# Patient Record
Sex: Male | Born: 1960
Health system: Southern US, Community
[De-identification: ages and names within clinical notes are randomized; demographics above are authoritative.]

## PROBLEM LIST (undated history)

## (undated) DIAGNOSIS — I10 Essential (primary) hypertension: Secondary | ICD-10-CM

## (undated) HISTORY — DX: Essential (primary) hypertension: I10

---

## 2015-06-29 ENCOUNTER — Ambulatory Visit (INDEPENDENT_AMBULATORY_CARE_PROVIDER_SITE_OTHER): Payer: Managed Care, Other (non HMO) | Admitting: *Deleted

## 2015-06-29 DIAGNOSIS — Z23 Encounter for immunization: Secondary | ICD-10-CM

## 2015-07-04 ENCOUNTER — Encounter: Payer: Self-pay | Admitting: Family Medicine

## 2015-07-04 ENCOUNTER — Ambulatory Visit (INDEPENDENT_AMBULATORY_CARE_PROVIDER_SITE_OTHER): Payer: Managed Care, Other (non HMO) | Admitting: Family Medicine

## 2015-07-04 VITALS — BP 149/75 | HR 64 | Temp 97.3°F | Ht 64.0 in | Wt 219.0 lb

## 2015-07-04 DIAGNOSIS — L929 Granulomatous disorder of the skin and subcutaneous tissue, unspecified: Secondary | ICD-10-CM

## 2015-07-04 DIAGNOSIS — L918 Other hypertrophic disorders of the skin: Secondary | ICD-10-CM | POA: Diagnosis not present

## 2015-07-04 NOTE — Progress Notes (Signed)
   Subjective:    Patient ID: Michael Carrillo, male    DOB: 11/29/1960, 54 y.o.   MRN: [id number]  HPI 54 year old gentleman who dropped a heavy object on his left great toe some years ago. Recently he was trying to trim the toe and there was some extra flash that he encountered and it has been sore since then. The extra flash persists he denies any bleeding but there is some pain and soreness    Review of Systems  Constitutional: Negative.   HENT: Negative.   Eyes: Negative.   Respiratory: Negative.  Negative for shortness of breath.   Cardiovascular: Negative.  Negative for chest pain and leg swelling.  Gastrointestinal: Negative.   Genitourinary: Negative.   Musculoskeletal: Negative.   Skin: Positive for wound.  Neurological: Negative.   Psychiatric/Behavioral: Negative.   All other systems reviewed and are negative.      There are no active problems to display for this patient.  No outpatient encounter prescriptions on file as of 07/04/2015.   No facility-administered encounter medications on file as of 07/04/2015.    Objective:   Physical Exam  Constitutional: He appears well-developed and well-nourished.  Skin:  Exam confined to left great toe. There is a small amount of granulation tissue overlapping the toenail from the lateral aspect. The toe was anesthetized with 1% cleaned with alcohol and then cauterize with silver nitrate. Hopefully this will resolve the issue. This granulation tissue should slough but I have instructed him that if he has any questions about how it's looking the first of next week to come back to the office.          Assessment & Plan:  1. Excessive granulation tissue of toe Procedure described above granulation tissue was cauterized using silver nitrate stick. He tolerated this well on simple dressing with 2 x 2 and Coban and an Neosporin was applied  Wardell Honour MD

## 2015-08-11 ENCOUNTER — Encounter: Payer: Self-pay | Admitting: Family Medicine

## 2015-08-11 ENCOUNTER — Ambulatory Visit (INDEPENDENT_AMBULATORY_CARE_PROVIDER_SITE_OTHER): Payer: Managed Care, Other (non HMO) | Admitting: Family Medicine

## 2015-08-11 VITALS — BP 154/81 | HR 70 | Temp 98.1°F | Ht 64.0 in | Wt 220.4 lb

## 2015-08-11 DIAGNOSIS — H7391 Unspecified disorder of tympanic membrane, right ear: Secondary | ICD-10-CM

## 2015-08-11 DIAGNOSIS — I10 Essential (primary) hypertension: Secondary | ICD-10-CM

## 2015-08-11 DIAGNOSIS — H7390 Unspecified disorder of tympanic membrane, unspecified ear: Secondary | ICD-10-CM | POA: Insufficient documentation

## 2015-08-11 MED ORDER — HYDROCHLOROTHIAZIDE 12.5 MG PO CAPS: 12.5000 mg | ORAL_CAPSULE | Freq: Every day | ORAL | Status: DC

## 2015-08-11 NOTE — Progress Notes (Signed)
   HPI  Patient presents today here with concern for something in his ear. Patient explains that yesterday he was working outside whenever he had above wind his right ear. He has irritation in that ear since that time. He had difficulty sleeping last night because of it. He denies any pain or current concerns for illness.  PMH: Smoking status noted ROS: Per HPI  Objective: BP 154/81 mmHg  Pulse 70  Temp(Src) 98.1 F (36.7 C) (Oral)  Ht 5\' 4"  (1.626 m)  Wt 220 lb 6.4 oz (99.973 kg)  BMI 37.81 kg/m2 Gen: NAD, alert, cooperative with exam HEENT: NCAT, right TM with no foreign bodies, he does have a hair touching his tympanic membrane CV: RRR, good S1/S2, no murmur Resp: CTABL, no wheezes, non-labored Ext: No edema, warm Neuro: Alert and oriented, No gross deficits  Assessment and plan:  # Tympanic membrane irritation Provided reassurance that there is no foreign body in his ear However I do see a hair touching his tympanic membrane, we have tried ear washing tonight in the clinic   # HTN Elevated on 2 occasions, recommended therapeutic lifestyle changes, offered medications and he would lik eto start them.  HCTZ, labs 4 week follow up  Laroy Apple, MD [facility] 08/11/2015, 4:54 PM

## 2015-08-11 NOTE — Patient Instructions (Signed)
Great to meet you!  If you have persistent problems we can get you set up to see An ENT to address the issue  Try to work in 20-30 minutes of aerobic exercise (fast walking is reasonable) 3-5 times per week, reduce you sodium intake  Come back in 6-8 weeks for a recheck of your blood pressure. You may need blood pressure medicine.

## 2015-08-12 LAB — CMP14+EGFR
ALK PHOS: 67 IU/L (ref 39–117)
ALT: 28 IU/L (ref 0–44)
AST: 32 IU/L (ref 0–40)
Albumin/Globulin Ratio: 1.8 (ref 1.1–2.5)
Albumin: 4.2 g/dL (ref 3.5–5.5)
BUN / CREAT RATIO: 17 (ref 9–20)
BUN: 19 mg/dL (ref 6–24)
CHLORIDE: 101 mmol/L (ref 97–106)
CO2: 26 mmol/L (ref 18–29)
Calcium: 9.1 mg/dL (ref 8.7–10.2)
Creatinine, Ser: 1.14 mg/dL (ref 0.76–1.27)
GFR calc Af Amer: 83 mL/min/{1.73_m2} (ref >59)
GFR calc non Af Amer: 71 mL/min/{1.73_m2} (ref >59)
GLUCOSE: 104 mg/dL — AB (ref 65–99)
Globulin, Total: 2.4 g/dL (ref 1.5–4.5)
Potassium: 3.9 mmol/L (ref 3.5–5.2)
SODIUM: 139 mmol/L (ref 136–144)
Total Protein: 6.6 g/dL (ref 6.0–8.5)

## 2015-08-12 LAB — CBC
Hematocrit: 45.9 % (ref 37.5–51.0)
Hemoglobin: 15.4 g/dL (ref 12.6–17.7)
MCH: 30.9 pg (ref 26.6–33.0)
MCHC: 33.6 g/dL (ref 31.5–35.7)
MCV: 92 fL (ref 79–97)
PLATELETS: 306 10*3/uL (ref 150–379)
RBC: 4.99 x10E6/uL (ref 4.14–5.80)
RDW: 13.8 % (ref 12.3–15.4)
WBC: 7.4 10*3/uL (ref 3.4–10.8)

## 2015-08-12 LAB — LIPID PANEL
CHOL/HDL RATIO: 2.5 ratio (ref 0.0–5.0)
CHOLESTEROL TOTAL: 185 mg/dL (ref 100–199)
HDL: 73 mg/dL (ref >39)
LDL CALC: 88 mg/dL (ref 0–99)
TRIGLYCERIDES: 119 mg/dL (ref 0–149)
VLDL Cholesterol Cal: 24 mg/dL (ref 5–40)

## 2015-08-15 ENCOUNTER — Telehealth: Payer: Self-pay | Admitting: *Deleted

## 2015-08-15 ENCOUNTER — Encounter: Payer: Self-pay | Admitting: *Deleted

## 2015-08-15 NOTE — Telephone Encounter (Signed)
Patient phone number is not in service.  Letter will be mailed with lab details and request for a new phone number.

## 2015-09-29 ENCOUNTER — Encounter: Payer: Self-pay | Admitting: Family Medicine

## 2015-09-29 ENCOUNTER — Ambulatory Visit (INDEPENDENT_AMBULATORY_CARE_PROVIDER_SITE_OTHER): Payer: Managed Care, Other (non HMO) | Admitting: Family Medicine

## 2015-09-29 VITALS — BP 132/72 | HR 72 | Temp 98.8°F | Ht 64.0 in | Wt 228.0 lb

## 2015-09-29 DIAGNOSIS — I10 Essential (primary) hypertension: Secondary | ICD-10-CM

## 2015-09-29 HISTORY — DX: Essential (primary) hypertension: I10

## 2015-09-29 MED ORDER — HYDROCHLOROTHIAZIDE 12.5 MG PO CAPS: 12.5000 mg | ORAL_CAPSULE | Freq: Every day | ORAL | Status: DC

## 2015-09-29 NOTE — Progress Notes (Signed)
   HPI  Patient presents today here for follow-up hypertension.  Patient reports good medication compliance He denies chest pain, dyspnea, palpitations, leg edema. He is not watching his diet very closely but is motivated to change. He is not checking his blood pressure at home.  His previous ear symptoms resolved shortly after leaving last visit  PMH: Smoking status noted ROS: Per HPI  Objective: BP 132/72 mmHg  Pulse 72  Temp(Src) 98.8 F (37.1 C) (Oral)  Ht '5\' 4"'$  (1.626 m)  Wt 228 lb (103.42 kg)  BMI 39.12 kg/m2 Gen: NAD, alert, cooperative with exam HEENT: NCAT, TMs normal bilaterally, oropharynx clear CV: RRR, good S1/S2, no murmur Resp: CTABL, no wheezes, non-labored Ext: No edema, warm Neuro: Alert and oriented, No gross deficits  Assessment and plan:  # Hypertension Well controlled Continue HCTZ Repeat labs, follow-up 4 months    Orders Placed This Encounter  Procedures  . CMP14+EGFR    Meds ordered this encounter  Medications  . hydrochlorothiazide (MICROZIDE) 12.5 MG capsule    Sig: Take 1 capsule (12.5 mg total) by mouth daily.    Dispense:  90 capsule    Refill:  [place], MD [facility] 09/29/2015, 5:00 PM

## 2015-09-29 NOTE — Patient Instructions (Signed)
Great to see you!  We will call with results within 1 week  Come back in 4 months

## 2015-10-02 ENCOUNTER — Other Ambulatory Visit: Payer: Self-pay | Admitting: Family Medicine

## 2015-10-02 ENCOUNTER — Telehealth: Payer: Self-pay

## 2015-10-02 NOTE — Telephone Encounter (Signed)
No changes as BP was controlled  Laroy Apple, MD [facility] 10/02/2015, 12:21 PM

## 2015-10-02 NOTE — Telephone Encounter (Signed)
Pt aware that his BP was under control so no changes in medication.

## 2015-10-02 NOTE — Telephone Encounter (Signed)
Called and left a voicemail describing that we wanted to keep his blood pressure medication the same because his blood pressure normalized throughout the exam.  Welcomed him to call back if he needs more clarification.   Laroy Apple, MD [facility] 10/02/2015, 4:05 PM

## 2015-10-02 NOTE — Telephone Encounter (Signed)
Pt called and was seen Friday and he thought you were changing his bp medication. He states you told him to stop old bp and start new one but when he went to pick up his new rx it was the same bp medication he was on. Please advise if he miss understood or your changing his bp medication.

## 2015-10-06 ENCOUNTER — Ambulatory Visit (INDEPENDENT_AMBULATORY_CARE_PROVIDER_SITE_OTHER): Payer: Managed Care, Other (non HMO) | Admitting: Family Medicine

## 2015-10-06 ENCOUNTER — Encounter: Payer: Self-pay | Admitting: Family Medicine

## 2015-10-06 VITALS — BP 127/60 | HR 77 | Temp 99.8°F | Ht 64.0 in | Wt 226.4 lb

## 2015-10-06 DIAGNOSIS — J209 Acute bronchitis, unspecified: Secondary | ICD-10-CM

## 2015-10-06 MED ORDER — FLUTICASONE PROPIONATE 50 MCG/ACT NA SUSP: 1.0000 | Freq: Two times a day (BID) | NASAL | Status: DC | PRN

## 2015-10-06 MED ORDER — AZITHROMYCIN 250 MG PO TABS: ORAL_TABLET | ORAL | Status: DC

## 2015-10-06 NOTE — Progress Notes (Signed)
BP 127/60 mmHg  Pulse 77  Temp(Src) 99.8 F (37.7 C) (Oral)  Ht 5\' 4"  (1.626 m)  Wt 226 lb 6.4 oz (102.694 kg)  BMI 38.84 kg/m2   Subjective:    Patient ID: Michael Carrillo, male    DOB: [birth year], 55 y.o.   MRN: [id number]  HPI: Randy Eggum is a 55 y.o. male presenting on 10/06/2015 for Bronchitis and Cough   HPI Sinus congestion and cough Patient has been having sinus congestion and cough for a 2 days but just today it started going down into his chest and his cough has become a lot more rattly. He denies any fevers or chills except a low-grade temperature he had ear which is 99.11F. He denies any sick contacts that he knows of. Most of his symptoms have been the nasal congestion and a postnasal drainage and a cough that is worse at night and pain coming across right side of his ribs with the coughing.  Relevant past medical, surgical, family and social history reviewed and updated as indicated. Interim medical history since our last visit reviewed. Allergies and medications reviewed and updated.  Review of Systems  Constitutional: Negative for fever and chills.  HENT: Positive for congestion, postnasal drip, rhinorrhea, sinus pressure and sore throat. Negative for ear discharge, ear pain, sneezing and voice change.   Eyes: Negative for pain, discharge, redness and visual disturbance.  Respiratory: Positive for cough and chest tightness. Negative for shortness of breath and wheezing.   Cardiovascular: Negative for chest pain and leg swelling.  Gastrointestinal: Negative for abdominal pain, diarrhea and constipation.  Genitourinary: Negative for difficulty urinating.  Musculoskeletal: Negative for back pain and gait problem.  Skin: Negative for rash.  Neurological: Negative for syncope, light-headedness and headaches.  All other systems reviewed and are negative.   Per HPI unless specifically indicated above     Medication List       This list is accurate as of:  10/06/15 11:57 AM.  Always use your most recent med list.               azithromycin 250 MG tablet  Commonly known as:  ZITHROMAX  Take 2 the first day and then one each day after.     fluticasone 50 MCG/ACT nasal spray  Commonly known as:  FLONASE  Place 1 spray into both nostrils 2 (two) times daily as needed for allergies or rhinitis.     hydrochlorothiazide 12.5 MG capsule  Commonly known as:  MICROZIDE  Take 1 capsule (12.5 mg total) by mouth daily.           Objective:    BP 127/60 mmHg  Pulse 77  Temp(Src) 99.8 F (37.7 C) (Oral)  Ht 5\' 4"  (1.626 m)  Wt 226 lb 6.4 oz (102.694 kg)  BMI 38.84 kg/m2  Wt Readings from Last 3 Encounters:  10/06/15 226 lb 6.4 oz (102.694 kg)  09/29/15 228 lb (103.42 kg)  08/11/15 220 lb 6.4 oz (99.973 kg)    Physical Exam  Constitutional: He is oriented to person, place, and time. He appears well-developed and well-nourished. No distress.  HENT:  Right Ear: Tympanic membrane, external ear and ear canal normal.  Left Ear: Tympanic membrane, external ear and ear canal normal.  Nose: Mucosal edema and rhinorrhea present. No sinus tenderness. No epistaxis. Right sinus exhibits no maxillary sinus tenderness and no frontal sinus tenderness. Left sinus exhibits no maxillary sinus tenderness and no frontal sinus tenderness.  Mouth/Throat: Uvula is midline  and mucous membranes are normal. Posterior oropharyngeal edema and posterior oropharyngeal erythema present. No oropharyngeal exudate or tonsillar abscesses.  Eyes: Conjunctivae and EOM are normal. Right eye exhibits no discharge. No scleral icterus.  Neck: Neck supple. No thyromegaly present.  Cardiovascular: Normal rate, regular rhythm, normal heart sounds and intact distal pulses.   No murmur heard. Pulmonary/Chest: Effort normal and breath sounds normal. No respiratory distress. He has no wheezes. He exhibits tenderness (right intercostal chest tenderness upon palpation).  Abdominal: He  exhibits no distension.  Musculoskeletal: Normal range of motion. He exhibits no edema.  Lymphadenopathy:    He has no cervical adenopathy.  Neurological: He is alert and oriented to person, place, and time. Coordination normal.  Skin: Skin is warm and dry. No rash noted. He is not diaphoretic.  Psychiatric: He has a normal mood and affect. His behavior is normal.  Vitals reviewed.      Assessment & Plan:   Problem List Items Addressed This Visit    None    Visit Diagnoses    Acute bronchitis, unspecified organism    -  Primary    Patient has sinus congestion as well, Flonase and antihistamine and Mucinex, is not working then pick up the azithromycin.    Relevant Medications    fluticasone (FLONASE) 50 MCG/ACT nasal spray    azithromycin (ZITHROMAX) 250 MG tablet        Follow up plan: Return if symptoms worsen or fail to improve.  Counseling provided for all of the vaccine components No orders of the defined types were placed in this encounter.    Caryl Pina, MD [facility] 10/06/2015, 11:57 AM

## 2015-12-27 ENCOUNTER — Encounter (INDEPENDENT_AMBULATORY_CARE_PROVIDER_SITE_OTHER): Payer: Self-pay

## 2016-01-01 ENCOUNTER — Ambulatory Visit (INDEPENDENT_AMBULATORY_CARE_PROVIDER_SITE_OTHER): Payer: Managed Care, Other (non HMO)

## 2016-01-01 ENCOUNTER — Ambulatory Visit (INDEPENDENT_AMBULATORY_CARE_PROVIDER_SITE_OTHER): Payer: Managed Care, Other (non HMO) | Admitting: Family Medicine

## 2016-01-01 ENCOUNTER — Encounter: Payer: Self-pay | Admitting: Family Medicine

## 2016-01-01 VITALS — BP 138/75 | HR 81 | Temp 98.2°F | Ht 64.0 in | Wt 226.0 lb

## 2016-01-01 DIAGNOSIS — M79642 Pain in left hand: Secondary | ICD-10-CM

## 2016-01-01 DIAGNOSIS — M79632 Pain in left forearm: Secondary | ICD-10-CM | POA: Diagnosis not present

## 2016-01-01 MED ORDER — CYCLOBENZAPRINE HCL 10 MG PO TABS: 10.0000 mg | ORAL_TABLET | Freq: Three times a day (TID) | ORAL | Status: DC | PRN

## 2016-01-01 MED ORDER — OXYCODONE-ACETAMINOPHEN 5-325 MG PO TABS: 1.0000 | ORAL_TABLET | Freq: Three times a day (TID) | ORAL | Status: DC | PRN

## 2016-01-01 NOTE — Patient Instructions (Signed)
Great to see you today!   Oxycodone is a narcotic, do not drive after you take it for at least 6 hours.   Flexeril is a muscle releaxer, it can be helpful for night time pain, it may make you sleepy  Continue etodolac.   Ry ice or heat to see if it helps.
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
## 2016-01-29 ENCOUNTER — Ambulatory Visit (INDEPENDENT_AMBULATORY_CARE_PROVIDER_SITE_OTHER): Payer: Managed Care, Other (non HMO) | Admitting: Family Medicine

## 2016-01-29 ENCOUNTER — Encounter: Payer: Self-pay | Admitting: Family Medicine

## 2016-01-29 VITALS — BP 139/73 | HR 55 | Temp 97.5°F | Ht 64.0 in | Wt 227.2 lb

## 2016-01-29 DIAGNOSIS — Z0000 Encounter for general adult medical examination without abnormal findings: Secondary | ICD-10-CM | POA: Insufficient documentation

## 2016-01-29 DIAGNOSIS — I10 Essential (primary) hypertension: Secondary | ICD-10-CM | POA: Diagnosis not present

## 2016-01-29 NOTE — Progress Notes (Signed)
   HPI  Patient presents today for follow-up hypertension.  Patient states he is doing well. Watching his diet much more closely than previous, he is watching his salt intake specifically. He's also cut back on fatty foods. 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  He denies any chest pain, shortness of breath, outpatient dictations, or leg edema.  He is willing to get a colonoscopy.  He states his wrist pain is improved.  PMH: Smoking status noted ROS: Per HPI  Objective: BP 139/73 mmHg  Pulse 55  Temp(Src) 97.5 F (36.4 C) (Oral)  Ht '5\' 4"'$  (1.626 m)  Wt 227 lb 3.2 oz (103.057 kg)  BMI 38.98 kg/m2 Gen: NAD, alert, cooperative with exam HEENT: NCAT, nares clear, TMs normal bilaterally, oropharynx clear CV: RRR, good S1/S2, no murmur Resp: CTABL, no wheezes, non-labored Ext: No edema, warm Neuro: Alert and oriented, No gross deficits  Assessment and plan:  # HTN Reasonable control Repeat BMP, has not been repeated since starting HCTZ in November Follow-up 4 months  # Healthcare maintenance Referring for colonoscopy Hep C antibody Checked     Orders Placed This Encounter  Procedures  . BMP8+EGFR  . Hepatitis C antibody  . Ambulatory referral to Gastroenterology    Referral Priority:  Routine    Referral Type:  Consultation    Referral Reason:  Specialty Services Required    Number of Visits Requested:  [place], MD [facility] 01/29/2016, 4:44 PM

## 2016-01-29 NOTE — Patient Instructions (Signed)
Great to see you!  We will call with lab results  You will be called to arrange a colonoscopy  Lets follow up in September.

## 2016-01-30 LAB — BMP8+EGFR
BUN/Creatinine Ratio: 17 (ref 9–20)
BUN: 17 mg/dL (ref 6–24)
CALCIUM: 9.5 mg/dL (ref 8.7–10.2)
CO2: 24 mmol/L (ref 18–29)
CREATININE: 0.98 mg/dL (ref 0.76–1.27)
Chloride: 98 mmol/L (ref 96–106)
GFR calc Af Amer: 99 mL/min/{1.73_m2} (ref >59)
GFR, EST NON AFRICAN AMERICAN: 86 mL/min/{1.73_m2} (ref >59)
Glucose: 97 mg/dL (ref 65–99)
POTASSIUM: 3.8 mmol/L (ref 3.5–5.2)
Sodium: 138 mmol/L (ref 134–144)

## 2016-01-30 LAB — HEPATITIS C ANTIBODY: Hep C Virus Ab: <0.1 s/co ratio (ref 0.0–0.9)

## 2016-02-01 ENCOUNTER — Telehealth: Payer: Self-pay

## 2016-02-07 NOTE — Telephone Encounter (Signed)
Pt said he will have to have a clinic not a hospital. I LMOM for a return call.  He can have his PCP refer him to [facility] or [facility].

## 2016-03-07 NOTE — Telephone Encounter (Signed)
PT is aware to have PCP send referral to [facility] or [facility].

## 2016-03-21 ENCOUNTER — Encounter: Payer: Managed Care, Other (non HMO) | Admitting: Family Medicine

## 2016-04-01 ENCOUNTER — Ambulatory Visit (INDEPENDENT_AMBULATORY_CARE_PROVIDER_SITE_OTHER): Payer: Managed Care, Other (non HMO) | Admitting: Family Medicine

## 2016-04-01 ENCOUNTER — Encounter: Payer: Self-pay | Admitting: Family Medicine

## 2016-04-01 VITALS — BP 123/59 | HR 77 | Temp 97.3°F | Ht 64.0 in | Wt 210.0 lb

## 2016-04-01 DIAGNOSIS — Z0000 Encounter for general adult medical examination without abnormal findings: Secondary | ICD-10-CM

## 2016-04-01 DIAGNOSIS — Z1211 Encounter for screening for malignant neoplasm of colon: Secondary | ICD-10-CM

## 2016-04-01 NOTE — Patient Instructions (Addendum)
Great to see you!  Lets plan on seeing you again in about 6 months to follow up for blood pressure and check your labs again.

## 2016-04-01 NOTE — Progress Notes (Signed)
   HPI  Patient presents today here for annual physical exam.  He has no complaints today.  Hypertension Medication compliance is good No chest pain, dyspnea, palpitations, leg edema.  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 He does not watch his diet very closely but states that he would like to.  He denies smoking.  He would like to get a colonoscopy but his insurance company is not covering it well.  His back pain is resolved.  PMH: Smoking status noted ROS: Per HPI  Objective: BP 123/59 mmHg  Pulse 77  Temp(Src) 97.3 F (36.3 C) (Oral)  Ht 5\' 4"  (1.626 m)  Wt 210 lb (95.255 kg)  BMI 36.03 kg/m2 Gen: NAD, alert, cooperative with exam HEENT: NCAT CV: RRR, good S1/S2, no murmur Resp: CTABL, no wheezes, non-labored Abd: SNTND, BS present, no guarding or organomegaly Ext: No edema, warm Neuro: Alert and oriented, strength 5/5 and sensation intact in bilateral lower extremities, 1+ patellar tendon reflexes symmetric  Assessment and plan:  # Annual physical exam Normal exam Labs are up-to-date Continue Offered tetanus shot today, however he will have to call his insurance company to see if it covered    # Hypertension Well-controlled on HCTZ Continue  # screening  for colon cancer GI were placed, he is working with his insurance coverage     Orders Placed This Encounter  Procedures  . Ambulatory referral to Gastroenterology    Referral Priority:  Routine    Referral Type:  Consultation    Referral Reason:  Specialty Services Required    Number of Visits Requested:  [place], MD [facility] 04/01/2016, 2:34 PM

## 2016-04-04 ENCOUNTER — Telehealth: Payer: Self-pay

## 2016-04-04 NOTE — Telephone Encounter (Addendum)
Gastroenterology Pre-Procedure Review  Request Date: 04/04/2016 Requesting Physician: Dr. Wendi Snipes at [facility]  Pt had told me previously that his insurance would not cover at a facility and he was going to ask PCP for referral to [facility] or [facility]. He called back and said they said that they would pay if he has it at the hospital. I made pt aware that I just check to see if he requires a prior authorization. He said he would like me to schedule the procedure for him.   PATIENT REVIEW QUESTIONS: The patient responded to the following health history questions as indicated:    1. Diabetes Melitis: no 2. Joint replacements in the past 12 months: no 3. Major health problems in the past 3 months: no 4. Has an artificial valve or MVP: no 5. Has a defibrillator: no 6. Has been advised in past to take antibiotics in advance of a procedure like teeth cleaning: no 7. Family history of colon cancer: no  8. Alcohol Use: He said he drinks 2 beers every other day ( he had originally told me he drinks 2 cans of beer on Friday) 9. History of sleep apnea: no   Note: he said he is not taking the oxycodone all of the time, but still has some    MEDICATIONS & ALLERGIES:    Patient reports the following regarding taking any blood thinners:   Plavix? no Aspirin? no Coumadin? no  Patient confirms/reports the following medications:  Current Outpatient Prescriptions  Medication Sig Dispense Refill  . cyclobenzaprine (FLEXERIL) 10 MG tablet Take 1 tablet (10 mg total) by mouth 3 (three) times daily as needed for muscle spasms. 30 tablet 0  . etodolac (LODINE) 400 MG tablet     . fluticasone (FLONASE) 50 MCG/ACT nasal spray Place 1 spray into both nostrils 2 (two) times daily as needed for allergies or rhinitis. 16 g 2  . hydrochlorothiazide (MICROZIDE) 12.5 MG capsule Take 1 capsule (12.5 mg total) by mouth daily. 90 capsule 3   No current facility-administered medications for this visit.    Patient  confirms/reports the following allergies:  No Known Allergies  No orders of the defined types were placed in this encounter.    AUTHORIZATION INFORMATION Primary Insurance:  ID #: Group #:  Pre-Cert / Auth required:  Pre-Cert / Auth #:   Secondary Insurance:  ID #:  Group #:  Pre-Cert / Auth required:  Pre-Cert / Auth #:   SCHEDULE INFORMATION: Procedure has been scheduled as follows:  Date:                    Time:   Location:   This Gastroenterology Pre-Precedure Review Form is being routed to the following provider(s): R. Garfield Cornea, MD

## 2016-04-05 NOTE — Telephone Encounter (Signed)
Please give Phenergan 25mg  IV 30 minutes before the procedure. Ok to schedule.

## 2016-04-08 ENCOUNTER — Other Ambulatory Visit: Payer: Self-pay

## 2016-04-08 DIAGNOSIS — Z1211 Encounter for screening for malignant neoplasm of colon: Secondary | ICD-10-CM

## 2016-04-08 MED ORDER — NA SULFATE-K SULFATE-MG SULF 17.5-3.13-1.6 GM/177ML PO SOLN: 1.0000 | ORAL | Status: DC

## 2016-04-08 MED ORDER — PROMETHAZINE HCL 25 MG/ML IJ SOLN: 25.0000 mg | Freq: Once | INTRAMUSCULAR | Status: AC

## 2016-04-08 NOTE — Telephone Encounter (Signed)
Spoke to pt's wife, Hilda Blades, and she took date and time.  He is scheduled for colonoscopy with Dr. Gala Romney on 05/01/2016 at 10:30.

## 2016-04-08 NOTE — Telephone Encounter (Signed)
Tried to call pt and could not leave a VM.  

## 2016-04-08 NOTE — Telephone Encounter (Signed)
Rx sent to the pharmacy and instructions mailed to pt.  

## 2016-04-09 ENCOUNTER — Telehealth: Payer: Self-pay | Admitting: Internal Medicine

## 2016-04-09 NOTE — Telephone Encounter (Signed)
Pt called asking to speak with DS. I told him DS was not available at the moment. Pt was transferred to VM per patient.

## 2016-04-09 NOTE — Telephone Encounter (Signed)
I have spoke to the pt. He is scheduled for 05/01/2016 at 11:30 Am with Dr. Gala Romney for the colonoscopy and I am mailing new instructions for the time change from 10:30 to 11:30 Am.  He is aware Rx was sent and he will call if there is a problem.

## 2016-04-29 ENCOUNTER — Telehealth: Payer: Self-pay

## 2016-04-29 NOTE — Telephone Encounter (Signed)
I called  [employer] at [phone] and spoke to [place] who said that at [place] is not required for a screening colonoscopy as out patient.

## 2016-04-30 ENCOUNTER — Other Ambulatory Visit: Payer: Self-pay | Admitting: Family Medicine

## 2016-04-30 DIAGNOSIS — Z0000 Encounter for general adult medical examination without abnormal findings: Secondary | ICD-10-CM

## 2016-05-01 ENCOUNTER — Ambulatory Visit (HOSPITAL_COMMUNITY)
Admission: RE | Admit: 2016-05-01 | Discharge: 2016-05-01 | Disposition: A | Discharge status: Home / Self Care | Payer: Managed Care, Other (non HMO) | Source: Ambulatory Visit | Attending: Internal Medicine | Admitting: Internal Medicine

## 2016-05-01 ENCOUNTER — Encounter (HOSPITAL_COMMUNITY)
Admission: RE | Disposition: A | Discharge status: Home / Self Care | Payer: Self-pay | Source: Ambulatory Visit | Attending: Internal Medicine

## 2016-05-01 ENCOUNTER — Encounter (HOSPITAL_COMMUNITY): Payer: Self-pay | Admitting: *Deleted

## 2016-05-01 DIAGNOSIS — D128 Benign neoplasm of rectum: Secondary | ICD-10-CM | POA: Insufficient documentation

## 2016-05-01 DIAGNOSIS — Z1211 Encounter for screening for malignant neoplasm of colon: Secondary | ICD-10-CM | POA: Diagnosis not present

## 2016-05-01 DIAGNOSIS — Z7982 Long term (current) use of aspirin: Secondary | ICD-10-CM | POA: Diagnosis not present

## 2016-05-01 DIAGNOSIS — Z79899 Other long term (current) drug therapy: Secondary | ICD-10-CM | POA: Diagnosis not present

## 2016-05-01 DIAGNOSIS — I10 Essential (primary) hypertension: Secondary | ICD-10-CM | POA: Diagnosis not present

## 2016-05-01 DIAGNOSIS — Z87891 Personal history of nicotine dependence: Secondary | ICD-10-CM | POA: Insufficient documentation

## 2016-05-01 HISTORY — PX: COLONOSCOPY: SHX5424

## 2016-05-01 HISTORY — PX: POLYPECTOMY: SHX5525

## 2016-05-01 SURGERY — COLONOSCOPY
Anesthesia: Moderate Sedation

## 2016-05-01 MED ORDER — MIDAZOLAM HCL 5 MG/5ML IJ SOLN
INTRAMUSCULAR | Status: DC | PRN
  Administered 2016-05-01 (×2): 2 mg via INTRAVENOUS

## 2016-05-01 MED ORDER — MIDAZOLAM HCL 5 MG/5ML IJ SOLN
INTRAMUSCULAR | Status: AC
  Filled 2016-05-01: qty 10

## 2016-05-01 MED ORDER — MEPERIDINE HCL 100 MG/ML IJ SOLN
INTRAMUSCULAR | Status: DC | PRN
  Administered 2016-05-01: 50 mg via INTRAVENOUS
  Administered 2016-05-01: 25 mg via INTRAVENOUS

## 2016-05-01 MED ORDER — STERILE WATER FOR IRRIGATION IR SOLN
Status: DC | PRN
  Administered 2016-05-01: 12:00:00

## 2016-05-01 MED ORDER — ONDANSETRON HCL 4 MG/2ML IJ SOLN
INTRAMUSCULAR | Status: DC | PRN
  Administered 2016-05-01: 4 mg via INTRAVENOUS

## 2016-05-01 MED ORDER — SODIUM CHLORIDE 0.9 % IV SOLN
INTRAVENOUS | Status: DC
  Administered 2016-05-01: 1000 mL via INTRAVENOUS

## 2016-05-01 MED ORDER — MEPERIDINE HCL 100 MG/ML IJ SOLN
INTRAMUSCULAR | Status: AC
  Filled 2016-05-01: qty 2

## 2016-05-01 MED ORDER — ONDANSETRON HCL 4 MG/2ML IJ SOLN
INTRAMUSCULAR | Status: AC
  Filled 2016-05-01: qty 2

## 2016-05-01 NOTE — Op Note (Signed)
[facility] Patient Name: Michael Carrillo Procedure Date: 05/01/2016 11:45 AM MRN: [id number] Date of Birth: October 03, 1960 Attending MD: Norvel Richards , MD CSN: [id number] Age: 55 Admit Type: Outpatient Procedure:                Colonoscopy with snare polypectomy Indications:              Screening for colorectal malignant neoplasm Providers:                Norvel Richards, MD, Jeanann Lewandowsky. Gwenlyn Perking RN, RN,                            Randa Spike, Technician Referring MD:              Medicines:                Midazolam 4 mg IV, Meperidine 75 mg IV, Ondansetron                            4 mg IV Complications:            No immediate complications. Estimated Blood Loss:     Estimated blood loss was minimal. Procedure:                Pre-Anesthesia Assessment:                           - Prior to the procedure, a History and Physical                            was performed, and patient medications and                            allergies were reviewed. The patient's tolerance of                            previous anesthesia was also reviewed. The risks                            and benefits of the procedure and the sedation                            options and risks were discussed with the patient.                            All questions were answered, and informed consent                            was obtained. Prior Anticoagulants: The patient has                            taken no previous anticoagulant or antiplatelet                            agents. ASA Grade Assessment: II - A patient with  mild systemic disease. After reviewing the risks                            and benefits, the patient was deemed in                            satisfactory condition to undergo the procedure.                           - Prior to the procedure, a History and Physical                            was performed, and patient medications and                 allergies were reviewed. The patient's tolerance of                            previous anesthesia was also reviewed. The risks                            and benefits of the procedure and the sedation                            options and risks were discussed with the patient.                            All questions were answered, and informed consent                            was obtained. Prior Anticoagulants: The patient has                            taken no previous anticoagulant or antiplatelet                            agents. ASA Grade Assessment: II - A patient with                            mild systemic disease. After reviewing the risks                            and benefits, the patient was deemed in                            satisfactory condition to undergo the procedure.                           After obtaining informed consent, the colonoscope                            was passed under direct vision. Throughout the  procedure, the patient's blood pressure, pulse, and                            oxygen saturations were monitored continuously. The                            EC-[id number] scope was introduced through                            the anus and advanced to the the cecum, identified                            by appendiceal orifice and ileocecal valve. The                            colonoscopy was performed without difficulty. The                            patient tolerated the procedure well. The quality                            of the bowel preparation was good. The entire colon                            was well visualized. The ileocecal valve,                            appendiceal orifice, and rectum were photographed. Scope In: 11:58:01 AM Scope Out: 12:09:36 PM Scope Withdrawal Time: 0 hours 8 minutes 34 seconds  Total Procedure Duration: 0 hours 11 minutes 35 seconds  Findings:      The perianal  and digital rectal examinations were normal.      A 4 mm polyp was found in the rectum. The polyp was sessile. The polyp       was removed with a cold snare. Resection and retrieval were complete.       Estimated blood loss was minimal.      The exam was otherwise without abnormality on direct and retroflexion       views. Impression:               - One 4 mm polyp in the rectum, removed with a cold                            snare. Resected and retrieved.                           - The examination was otherwise normal on direct                            and retroflexion views. Moderate Sedation:      Moderate (conscious) sedation was administered by the endoscopy nurse       and supervised by the endoscopist. The following parameters were       monitored: oxygen saturation, heart rate, blood pressure, respiratory       rate, EKG,  adequacy of pulmonary ventilation, and response to care.       Total physician intraservice time was 19 minutes. Recommendation:           - Patient has a contact number available for                            emergencies. The signs and symptoms of potential                            delayed complications were discussed with the                            patient. Return to normal activities tomorrow.                            Written discharge instructions were provided to the                            patient.                           - Resume previous diet.                           - Continue present medications.                           - Repeat colonoscopy date to be determined after                            pending pathology results are reviewed for                            surveillance based on pathology results.                           - Return to GI clinic (date not yet determined). Procedure Code(s):        --- Professional ---                           [phone], Colonoscopy, flexible; with removal of                            tumor(s),  polyp(s), or other lesion(s) by snare                            technique                           99152, Moderate sedation services provided by the                            same physician or other qualified health care                            professional performing the  diagnostic or                            therapeutic service that the sedation supports,                            requiring the presence of an independent trained                            observer to assist in the monitoring of the                            patient's level of consciousness and physiological                            status; initial 15 minutes of intraservice time,                            patient age 69 years or older Diagnosis Code(s):        --- Professional ---                           Z12.11, Encounter for screening for malignant                            neoplasm of colon                           K62.1, Rectal polyp CPT copyright 2016 [employer]. All rights reserved. The codes documented in this report are preliminary and upon coder review may  be revised to meet current compliance requirements. Cristopher Estimable. Rourk, MD Norvel Richards, MD 05/01/2016 12:14:00 PM This report has been signed electronically. Number of Addenda: 0

## 2016-05-01 NOTE — Discharge Instructions (Signed)
°Colonoscopy °Discharge Instructions ° °Read the instructions outlined below and refer to this sheet in the next few weeks. These discharge instructions provide you with general information on caring for yourself after you leave the hospital. Your doctor may also give you specific instructions. While your treatment has been planned according to the most current medical practices available, unavoidable complications occasionally occur. If you have any problems or questions after discharge, call Dr. Rourk at [phone]. °ACTIVITY °· You may resume your regular activity, but move at a slower pace for the next 24 hours.  °· Take frequent rest periods for the next 24 hours.  °· Walking will help get rid of the air and reduce the bloated feeling in your belly (abdomen).  °· No driving for 24 hours (because of the medicine (anesthesia) used during the test).   °· Do not sign any important legal documents or operate any machinery for 24 hours (because of the anesthesia used during the test).  °NUTRITION °· Drink plenty of fluids.  °· You may resume your normal diet as instructed by your doctor.  °· Begin with a light meal and progress to your normal diet. Heavy or fried foods are harder to digest and may make you feel sick to your stomach (nauseated).  °· Avoid alcoholic beverages for 24 hours or as instructed.  °MEDICATIONS °· You may resume your normal medications unless your doctor tells you otherwise.  °WHAT YOU CAN EXPECT TODAY °· Some feelings of bloating in the abdomen.  °· Passage of more gas than usual.  °· Spotting of blood in your stool or on the toilet paper.  °IF YOU HAD POLYPS REMOVED DURING THE COLONOSCOPY: °· No aspirin products for 7 days or as instructed.  °· No alcohol for 7 days or as instructed.  °· Eat a soft diet for the next 24 hours.  °FINDING OUT THE RESULTS OF YOUR TEST °Not all test results are available during your visit. If your test results are not back during the visit, make an appointment  with your caregiver to find out the results. Do not assume everything is normal if you have not heard from your caregiver or the medical facility. It is important for you to follow up on all of your test results.  °SEEK IMMEDIATE MEDICAL ATTENTION IF: °· You have more than a spotting of blood in your stool.  °· Your belly is swollen (abdominal distention).  °· You are nauseated or vomiting.  °· You have a temperature over 101.  °· You have abdominal pain or discomfort that is severe or gets worse throughout the day.  ° ° °Colon polyp information provided °Further recommendations to follow pending review of pathology report ° ° °Colon Polyps °Polyps are lumps of extra tissue growing inside the body. Polyps can grow in the large intestine (colon). Most colon polyps are noncancerous (benign). However, some colon polyps can become cancerous over time. Polyps that are larger than a pea may be harmful. To be safe, caregivers remove and test all polyps. °CAUSES  °Polyps form when mutations in the genes cause your cells to grow and divide even though no more tissue is needed. °RISK FACTORS °There are a number of risk factors that can increase your chances of getting colon polyps. They include: °· Being older than 50 years. °· Family history of colon polyps or colon cancer. °· Long-term colon diseases, such as colitis or Crohn disease. °· Being overweight. °· Smoking. °· Being inactive. °· Drinking too much alcohol. °  SYMPTOMS  °Most small polyps do not cause symptoms. If symptoms are present, they may include: °· Blood in the stool. The stool may look dark red or black. °· Constipation or diarrhea that lasts longer than 1 week. °DIAGNOSIS °People often do not know they have polyps until their caregiver finds them during a regular checkup. Your caregiver can use 4 tests to check for polyps: °· Digital rectal exam. The caregiver wears gloves and feels inside the rectum. This test would find polyps only in the rectum. °· Barium  enema. The caregiver puts a liquid called barium into your rectum before taking X-rays of your colon. Barium makes your colon look white. Polyps are dark, so they are easy to see in the X-ray pictures. °· Sigmoidoscopy. A thin, flexible tube (sigmoidoscope) is placed into your rectum. The sigmoidoscope has a light and tiny camera in it. The caregiver uses the sigmoidoscope to look at the last third of your colon. °· Colonoscopy. This test is like sigmoidoscopy, but the caregiver looks at the entire colon. This is the most common method for finding and removing polyps. °TREATMENT  °Any polyps will be removed during a sigmoidoscopy or colonoscopy. The polyps are then tested for cancer. °PREVENTION  °To help lower your risk of getting more colon polyps: °· Eat plenty of fruits and vegetables. Avoid eating fatty foods. °· Do not smoke. °· Avoid drinking alcohol. °· Exercise every day. °· Lose weight if recommended by your caregiver. °· Eat plenty of calcium and folate. Foods that are rich in calcium include milk, cheese, and broccoli. Foods that are rich in folate include chickpeas, kidney beans, and spinach. °HOME CARE INSTRUCTIONS °Keep all follow-up appointments as directed by your caregiver. You may need periodic exams to check for polyps. °SEEK MEDICAL CARE IF: °You notice bleeding during a bowel movement. °  °This information is not intended to replace advice given to you by your health care provider. Make sure you discuss any questions you have with your health care provider. °  °Document Released: 06/05/2004 Document Revised: 09/30/2014 Document Reviewed: 11/19/2011 °Elsevier Interactive Patient Education ©2016 [employer] ° °

## 2016-05-01 NOTE — H&P (Signed)
@LOGO @   Primary Care Physician:  Kenn File, MD Primary Gastroenterologist:  Dr. Gala Romney  Pre-Procedure History & Physical: HPI:  Michael Carrillo is a 55 y.o. male is here for a screening colonoscopy.  No prior screening examination. No bowel symptoms. No family history of colon cancer.  Past Medical History:  Diagnosis Date  . HTN (hypertension) 09/29/2015    History reviewed. No pertinent surgical history.  Prior to Admission medications   Medication Sig Start Date End Date Taking? Authorizing Provider  aspirin EC 81 MG tablet Take 81 mg by mouth daily.   Yes Historical Provider, MD  hydrochlorothiazide (MICROZIDE) 12.5 MG capsule Take 1 capsule (12.5 mg total) by mouth daily. 09/29/15  Yes Timmothy Euler, MD  Na Sulfate-K Sulfate-Mg Sulf (SUPREP BOWEL PREP KIT) 17.5-3.13-1.6 GM/180ML SOLN Take 1 kit by mouth as directed. 04/08/16  Yes Danie Binder, MD  cyclobenzaprine (FLEXERIL) 10 MG tablet Take 1 tablet (10 mg total) by mouth 3 (three) times daily as needed for muscle spasms. 01/01/16   Timmothy Euler, MD  etodolac (LODINE) 400 MG tablet  12/21/15   Historical Provider, MD  fluticasone (FLONASE) 50 MCG/ACT nasal spray Place 1 spray into both nostrils 2 (two) times daily as needed for allergies or rhinitis. 10/06/15   Fransisca Kaufmann Dettinger, MD    Allergies as of 04/08/2016  . (No Known Allergies)    Family History  Problem Relation Age of Onset  . Diabetes Mother   . Hypertension Mother   . Asthma Father   . Hypertension Sister     Social History   Social History  . Marital status: Married    Spouse name: N/A  . Number of children: N/A  . Years of education: N/A   Occupational History  . Not on file.   Social History Main Topics  . Smoking status: Former Smoker    Quit date: 07/03/2000  . Smokeless tobacco: Never Used     Comment: quit 15 years ago  . Alcohol use 0.0 oz/week     Comment: beer every now and then  . Drug use: No  . Sexual activity: Not on  file   Other Topics Concern  . Not on file   Social History Narrative  . No narrative on file    Review of Systems: See HPI, otherwise negative ROS  Physical Exam: BP 135/66   Pulse (!) 57   Temp 98.6 F (37 C) (Oral)   Resp 15   Ht 5' 4"  (1.626 m)   Wt 210 lb (95.3 kg)   SpO2 98%   BMI 36.05 kg/m  General:   Alert,  Well-developed, well-nourished, pleasant and cooperative in NAD Head:  Normocephalic and atraumatic. Eyes:  Sclera clear, no icterus.   Conjunctiva pink. Lungs:  Clear throughout to auscultation.   No wheezes, crackles, or rhonchi. No acute distress. Heart:  Regular rate and rhythm; no murmurs, clicks, rubs,  or gallops. Abdomen:  Soft, nontender and nondistended. No masses, hepatosplenomegaly or hernias noted. Normal bowel sounds, without guarding, and without rebound.   Extremities:  Without clubbing or edema.    Impression:  Michael Carrillo is now here to undergo a screening colonoscopy.  First ever average risk screening examination. Risks, benefits, limitations, imponderables and alternatives regarding colonoscopy have been reviewed with the patient. Questions have been answered. All parties agreeable.     Notice:  This dictation was prepared with Dragon dictation along with smaller phrase technology. Any transcriptional errors that result from  this process are unintentional and may not be corrected upon review.

## 2016-05-06 ENCOUNTER — Encounter (HOSPITAL_COMMUNITY): Payer: Self-pay | Admitting: Internal Medicine

## 2016-05-07 ENCOUNTER — Encounter: Payer: Self-pay | Admitting: Internal Medicine

## 2016-06-25 ENCOUNTER — Ambulatory Visit (INDEPENDENT_AMBULATORY_CARE_PROVIDER_SITE_OTHER): Payer: Managed Care, Other (non HMO)

## 2016-06-25 DIAGNOSIS — Z23 Encounter for immunization: Secondary | ICD-10-CM | POA: Diagnosis not present

## 2016-08-13 ENCOUNTER — Other Ambulatory Visit: Payer: Self-pay | Admitting: Family Medicine

## 2016-08-14 MED ORDER — HYDROCHLOROTHIAZIDE 12.5 MG PO CAPS: 12.5000 mg | ORAL_CAPSULE | Freq: Every day | ORAL | 0 refills | Status: DC

## 2016-08-14 NOTE — Telephone Encounter (Signed)
Sent!

## 2016-10-17 ENCOUNTER — Ambulatory Visit (INDEPENDENT_AMBULATORY_CARE_PROVIDER_SITE_OTHER): Payer: Managed Care, Other (non HMO) | Admitting: Family Medicine

## 2016-10-17 ENCOUNTER — Encounter: Payer: Self-pay | Admitting: Family Medicine

## 2016-10-17 VITALS — BP 122/66 | HR 86 | Temp 98.2°F | Ht 64.0 in | Wt 222.0 lb

## 2016-10-17 DIAGNOSIS — Z6838 Body mass index (BMI) 38.0-38.9, adult: Secondary | ICD-10-CM | POA: Diagnosis not present

## 2016-10-17 DIAGNOSIS — E6609 Other obesity due to excess calories: Secondary | ICD-10-CM

## 2016-10-17 DIAGNOSIS — I10 Essential (primary) hypertension: Secondary | ICD-10-CM

## 2016-10-17 DIAGNOSIS — IMO0001 Reserved for inherently not codable concepts without codable children: Secondary | ICD-10-CM

## 2016-10-17 DIAGNOSIS — E669 Obesity, unspecified: Secondary | ICD-10-CM | POA: Insufficient documentation

## 2016-10-17 DIAGNOSIS — M62838 Other muscle spasm: Secondary | ICD-10-CM

## 2016-10-17 MED ORDER — HYDROCHLOROTHIAZIDE 12.5 MG PO CAPS: 12.5000 mg | ORAL_CAPSULE | Freq: Every day | ORAL | 3 refills | Status: DC

## 2016-10-17 MED ORDER — CYCLOBENZAPRINE HCL 10 MG PO TABS: 10.0000 mg | ORAL_TABLET | Freq: Three times a day (TID) | ORAL | 5 refills | Status: DC | PRN

## 2016-10-17 NOTE — Patient Instructions (Addendum)
Great to see you!  Come back in the next 2 weeks and get fasting labs.

## 2016-10-17 NOTE — Progress Notes (Signed)
   HPI  Patient presents today here to follow-up for hypertension and other chronic medical conditions.  Muscle spasms in the hands and back are controlled well with cyclobenzaprine, requests refill Taking on average 1-2 a day.  Obesity Patient is nonfasting today He is active at work but does not have a regular exercise routine. Watching diet is fluctuating  HTN Good medication compliance, no side effects from medications, needs refills.  PMH: Smoking status noted ROS: Per HPI  Objective: BP 122/66   Pulse 86   Temp 98.2 F (36.8 C) (Oral)   Ht '5\' 4"'$  (1.626 m)   Wt 222 lb (100.7 kg)   BMI 38.11 kg/m  Gen: NAD, alert, cooperative with exam HEENT: NCAT, EOMI, PERRL CV: RRR, good S1/S2, no murmur Resp: CTABL, no wheezes, non-labored Abd: SNTND, BS present, no guarding or organomegaly Ext: No edema, warm Neuro: Alert and oriented, No gross deficits  Assessment and plan:  # Hypertension Well-controlled Fasting labs arranged Continue HCTZ, refilled  # Muscle spasms Complains of multiple muscle spasms helped well by cyclobenzaprine, refilled  # Obesity Worsening Discussed theraputic lifestyle changes.      Orders Placed This Encounter  Procedures  . Lipid panel    Standing Status:   Future    Standing Expiration Date:   10/17/2017  . CMP14+EGFR    Standing Status:   Future    Standing Expiration Date:   10/17/2017  . CBC with Differential/Platelet    Standing Status:   Future    Standing Expiration Date:   10/17/2017    Meds ordered this encounter  Medications  . hydrochlorothiazide (MICROZIDE) 12.5 MG capsule    Sig: Take 1 capsule (12.5 mg total) by mouth daily.    Dispense:  90 capsule    Refill:  3  . cyclobenzaprine (FLEXERIL) 10 MG tablet    Sig: Take 1 tablet (10 mg total) by mouth 3 (three) times daily as needed for muscle spasms.    Dispense:  60 tablet    Refill:  Lucas, MD [facility] 10/17/2016, 4:25  PM

## 2016-10-22 ENCOUNTER — Other Ambulatory Visit: Payer: Self-pay | Admitting: Family Medicine

## 2016-10-22 DIAGNOSIS — J209 Acute bronchitis, unspecified: Secondary | ICD-10-CM

## 2016-10-22 MED ORDER — FLUTICASONE PROPIONATE 50 MCG/ACT NA SUSP: 1.0000 | Freq: Two times a day (BID) | NASAL | 1 refills | Status: DC | PRN

## 2016-10-22 NOTE — Telephone Encounter (Signed)
Refill sent per protocol

## 2016-11-19 ENCOUNTER — Telehealth: Payer: Self-pay | Admitting: Family Medicine

## 2016-11-20 NOTE — Telephone Encounter (Signed)
Already done and at [place], lmom
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
## 2017-02-24 ENCOUNTER — Telehealth: Payer: Self-pay | Admitting: Family Medicine

## 2017-02-24 NOTE — Telephone Encounter (Signed)
What is the name of the medication? hydrochlorothiazide (MICROZIDE) 12.5 MG capsule  Have you contacted your pharmacy to request a refill? yes [facility] told pt they have sent over fax  Which pharmacy would you like this sent to? [facility] [place]   Patient notified that their request is being sent to the clinical staff for review and that they should receive a call once it is complete. If they do not receive a call within 24 hours they can check with their pharmacy or our office.

## 2017-02-25 MED ORDER — HYDROCHLOROTHIAZIDE 12.5 MG PO CAPS: 12.5000 mg | ORAL_CAPSULE | Freq: Every day | ORAL | 0 refills | Status: DC

## 2017-02-25 NOTE — Telephone Encounter (Signed)
done

## 2017-03-04 ENCOUNTER — Encounter: Payer: Self-pay | Admitting: Physician Assistant

## 2017-03-04 ENCOUNTER — Ambulatory Visit (INDEPENDENT_AMBULATORY_CARE_PROVIDER_SITE_OTHER): Payer: Managed Care, Other (non HMO) | Admitting: Physician Assistant

## 2017-03-04 VITALS — BP 137/70 | HR 84 | Temp 100.0°F | Ht 64.0 in | Wt 209.4 lb

## 2017-03-04 DIAGNOSIS — J028 Acute pharyngitis due to other specified organisms: Secondary | ICD-10-CM

## 2017-03-04 DIAGNOSIS — R066 Hiccough: Secondary | ICD-10-CM

## 2017-03-04 MED ORDER — METOCLOPRAMIDE HCL 10 MG PO TABS: 10.0000 mg | ORAL_TABLET | Freq: Three times a day (TID) | ORAL | 0 refills | Status: DC | PRN

## 2017-03-04 MED ORDER — AMOXICILLIN 875 MG PO TABS: 875.0000 mg | ORAL_TABLET | Freq: Two times a day (BID) | ORAL | 0 refills | Status: DC

## 2017-03-04 NOTE — Patient Instructions (Signed)
Hiccups A hiccup is the result of a sudden shortening of the muscle below your lungs (diaphragm). This movement of your diaphragm causes a sudden inhalation followed by the closing of your vocal cords, which causes the hiccup sound. Most people get the hiccups. Typically, hiccups last only a short amount of time. There are three types of hiccups:  Benign. These hiccups last less than 48 hours.  Persistent. These hiccups last more than 48 hours, but less than 1 month.  Intractable. These hiccups last more than 1 month.  A hiccup is a reflex. You cannot control reflexes. Follow these instructions at home: Watch your hiccups for any changes. The following actions may help to lessen any discomfort that you are feeling:  Eat small meals.  Limit alcohol intake to no more than 1 drink per day for nonpregnant women and 2 drinks per day for men. One drink equals 12 oz of beer, 5 oz of wine, or 1 oz of hard liquor.  Limit drinking carbonated or fizzy drinks, such as soda.  Eat and chew your food slowly.  Avoid eating or drinking hot or spicy foods and drinks.  Take medicines only as directed by your health care provider.  Contact a health care provider if:  Your hiccups last for more than 48 hours.  Your hiccups do not improve with treatment.  You cannot sleep or eat due to the hiccups.  You have unexpected weight loss due to the hiccups.  You have a fever.  You have trouble breathing or swallowing.  You develop severe pain in your abdomen.  You develop numbness, tingling, or weakness. This information is not intended to replace advice given to you by your health care provider. Make sure you discuss any questions you have with your health care provider. Document Released: 11/18/2001 Document Revised: 02/15/2016 Document Reviewed: 09/05/2014 Elsevier Interactive Patient Education  2018 [place]. Pharyngitis Pharyngitis is redness, pain, and swelling (inflammation) of your  pharynx. What are the causes? Pharyngitis is usually caused by infection. Most of the time, these infections are from viruses (viral) and are part of a cold. However, sometimes pharyngitis is caused by bacteria (bacterial). Pharyngitis can also be caused by allergies. Viral pharyngitis may be spread from person to person by coughing, sneezing, and personal items or utensils (cups, forks, spoons, toothbrushes). Bacterial pharyngitis may be spread from person to person by more intimate contact, such as kissing. What are the signs or symptoms? Symptoms of pharyngitis include:  Sore throat.  Tiredness (fatigue).  Low-grade fever.  Headache.  Joint pain and muscle aches.  Skin rashes.  Swollen lymph nodes.  Plaque-like film on throat or tonsils (often seen with bacterial pharyngitis).  How is this diagnosed? Your health care provider will ask you questions about your illness and your symptoms. Your medical history, along with a physical exam, is often all that is needed to diagnose pharyngitis. Sometimes, a rapid strep test is done. Other lab tests may also be done, depending on the suspected cause. How is this treated? Viral pharyngitis will usually get better in 3-4 days without the use of medicine. Bacterial pharyngitis is treated with medicines that kill germs (antibiotics). Follow these instructions at home:  Drink enough water and fluids to keep your urine clear or pale yellow.  Only take over-the-counter or prescription medicines as directed by your health care provider: ? If you are prescribed antibiotics, make sure you finish them even if you start to feel better. ? Do not take aspirin.  Get lots of rest.  Gargle with 8 oz of salt water ( tsp of salt per 1 qt of water) as often as every 1-2 hours to soothe your throat.  Throat lozenges (if you are not at risk for choking) or sprays may be used to soothe your throat. Contact a health care provider if:  You have large,  tender lumps in your neck.  You have a rash.  You cough up green, yellow-brown, or bloody spit. Get help right away if:  Your neck becomes stiff.  You drool or are unable to swallow liquids.  You vomit or are unable to keep medicines or liquids down.  You have severe pain that does not go away with the use of recommended medicines.  You have trouble breathing (not caused by a stuffy nose). This information is not intended to replace advice given to you by your health care provider. Make sure you discuss any questions you have with your health care provider. Document Released: 09/09/2005 Document Revised: 02/15/2016 Document Reviewed: 05/17/2013 Elsevier Interactive Patient Education  2017 [employer].

## 2017-03-04 NOTE — Progress Notes (Signed)
Subjective:     Patient ID: Michael Carrillo, male   DOB: 1961-09-15, 56 y.o.   MRN: [id number]  HPI Pt noted fatigue, chills, and nausea this weekend after cleaning several cars Due to sx he went to the Urgent Care They started him on Zofran Ever since he has noted severe S/T and hiccups constantly  Review of Systems  Constitutional: Positive for activity change, appetite change, chills, fatigue and fever.  HENT: Positive for trouble swallowing. Negative for congestion, drooling, postnasal drip, rhinorrhea, sinus pain, sinus pressure and sneezing.   Respiratory: Negative.   Gastrointestinal: Positive for nausea. Negative for abdominal distention and abdominal pain.       Objective:   Physical Exam  Constitutional: He appears well-developed and well-nourished.  HENT:  Mouth/Throat: Oropharyngeal exudate present.  Tonsils enlarged with erythema and exudate  Neck: Neck supple.  Cardiovascular: Normal rate, regular rhythm and normal heart sounds.   No murmur heard. Pulmonary/Chest: Effort normal and breath sounds normal. No respiratory distress. He has no wheezes.  Hiccups during exam  Lymphadenopathy:    He has cervical adenopathy.  Nursing note and vitals reviewed.      Assessment:     1. Pharyngitis due to other organism   2. Hiccups        Plan:     Fluids Rest Amox 875mg  bid x 10 days Reglan 10mg  tid prn OTC meds for sx F/U in 2 days

## 2017-03-05 ENCOUNTER — Telehealth: Payer: Self-pay | Admitting: Physician Assistant

## 2017-03-05 MED ORDER — CHLORPROMAZINE HCL 25 MG PO TABS: 25.0000 mg | ORAL_TABLET | Freq: Three times a day (TID) | ORAL | 0 refills | Status: AC

## 2017-03-05 NOTE — Telephone Encounter (Signed)
What symptoms do you have? Throat problem and hiccups  How long have you been sick? About a week  Have you been seen for this problem? Yes on 03/04/2017 by Thomasene Mohair  If your provider decides to give you a prescription, which pharmacy would you like for it to be sent to? Medication that was prescribed is not working he is still having the same symptoms feels like he is choking  Patient informed that this information will be sent to the clinical staff for review and that they should receive a follow up call.

## 2017-03-05 NOTE — Telephone Encounter (Signed)
Hiccups have not been relieved When he gets hot they start up without relief Was not able to sleep last pm has bee going on for a week.

## 2017-03-05 NOTE — Telephone Encounter (Signed)
Call pt switch to Thorazine 25mg  tid prn Stop Reglan Med may make sleepy Continue ATB

## 2017-03-05 NOTE — Telephone Encounter (Signed)
Pt aware Rx sent to pharmacy 

## 2017-03-06 ENCOUNTER — Ambulatory Visit (INDEPENDENT_AMBULATORY_CARE_PROVIDER_SITE_OTHER): Payer: Managed Care, Other (non HMO) | Admitting: Pediatrics

## 2017-03-06 ENCOUNTER — Ambulatory Visit (INDEPENDENT_AMBULATORY_CARE_PROVIDER_SITE_OTHER): Payer: Managed Care, Other (non HMO)

## 2017-03-06 ENCOUNTER — Encounter: Payer: Self-pay | Admitting: Pediatrics

## 2017-03-06 VITALS — BP 131/66 | HR 82 | Temp 99.4°F | Ht 64.0 in | Wt 210.8 lb

## 2017-03-06 DIAGNOSIS — R509 Fever, unspecified: Secondary | ICD-10-CM

## 2017-03-06 DIAGNOSIS — R066 Hiccough: Secondary | ICD-10-CM

## 2017-03-06 LAB — URINALYSIS, COMPLETE
Bilirubin, UA: NEGATIVE
GLUCOSE, UA: NEGATIVE
Ketones, UA: NEGATIVE
Leukocytes, UA: NEGATIVE
Nitrite, UA: NEGATIVE
PROTEIN UA: NEGATIVE
SPEC GRAV UA: 1.015 (ref 1.005–1.030)
Urobilinogen, Ur: 0.2 mg/dL (ref 0.2–1.0)
pH, UA: 5.5 (ref 5.0–7.5)

## 2017-03-06 LAB — MICROSCOPIC EXAMINATION
BACTERIA UA: NONE SEEN
Renal Epithel, UA: NONE SEEN /hpf
WBC, UA: NONE SEEN /hpf (ref 0–?)

## 2017-03-06 NOTE — Progress Notes (Signed)
  Subjective:   Patient ID: Michael Carrillo, male    DOB: Jan 25, 1961, 56 y.o.   MRN: [id number] CC: Hiccups and Fever  HPI: Michael Carrillo is a 56 y.o. male presenting for Hiccups and Fever  Went to urgent care 4 days ago for diarrhea, had some emesis as well Lasted less than 24h, symptoms now resolved Says he was started on medication for his diarrhea, isnt taking it anymore  Started having hiccups that night with the diarrhea Seen two days here for pharyngitis and hiccups Started on reglan, says it didn't help his hiccups At night, about 7pm he starts having hiccups and thye last all night No hiccups now Does have them sometimes during the day Last night had temperature up to 105 Has had temps up to 102 past few days  Now says nothing is bothering him No headache No abd pain Has been stooling regularly Appetite has been fine Is dreading tonight because of the hiccups  No new sexual partners   Relevant past medical, surgical, family and social history reviewed. Allergies and medications reviewed and updated. History  Smoking Status  . Former Smoker  . Quit date: 07/03/2000  Smokeless Tobacco  . Never Used    Comment: quit 15 years ago   ROS: Per HPI   Objective:    BP 131/66   Pulse 82   Temp 99.4 F (37.4 C) (Oral)   Ht _0  (1.626 m)   Wt 210 lb 12.8 oz (95.6 kg)   BMI 36.18 kg/m   Wt Readings from Last 3 Encounters:  03/06/17 210 lb 12.8 oz (95.6 kg)  03/04/17 209 lb 6.4 oz (95 kg)  10/17/16 222 lb (100.7 kg)    Gen: NAD, alert, cooperative with exam, NCAT EYES: EOMI, mild proptosis, mild conjunctival injection, or no icterus ENT:  TMs pearly gray b/l, OP without erythema LYMPH: no cervical LAD CV: NRRR, normal S1/S2, no murmur, distal pulses 2+ b/l Resp: CTABL, no wheezes, normal WOB Abd: +BS, soft, NTND. no guarding or organomegaly Ext: No edema, warm Neuro: Alert and oriented, strength equal b/l UE and LE, coordination grossly normal MSK:  normal muscle bulk  Assessment & Plan:  Michael Carrillo was seen today for hiccups and fever.  Diagnoses and all orders for this visit:  Fever, unspecified fever cause No temp now Ongoing several days Discussed return precautions Will check below labs -     CBC with Differential/Platelet -     BMP8+EGFR -     Thyroid Panel With TSH -     Urinalysis, Complete -     DG Chest 2 View; Future  Hiccups Start alternate med sent in yesterday for hiccups   Follow up plan: 1 week, sooner if needed Assunta Found, MD [place]

## 2017-03-07 ENCOUNTER — Telehealth: Payer: Self-pay | Admitting: Family Medicine

## 2017-03-07 LAB — CBC WITH DIFFERENTIAL/PLATELET
BASOS: 0 %
Basophils Absolute: 0 10*3/uL (ref 0.0–0.2)
EOS (ABSOLUTE): 0.2 10*3/uL (ref 0.0–0.4)
EOS: 3 %
HEMATOCRIT: 44.2 % (ref 37.5–51.0)
HEMOGLOBIN: 15.3 g/dL (ref 13.0–17.7)
IMMATURE GRANULOCYTES: 1 %
Immature Grans (Abs): 0.1 10*3/uL (ref 0.0–0.1)
LYMPHS ABS: 2.1 10*3/uL (ref 0.7–3.1)
Lymphs: 32 %
MCH: 30 pg (ref 26.6–33.0)
MCHC: 34.6 g/dL (ref 31.5–35.7)
MCV: 87 fL (ref 79–97)
MONOS ABS: 0.8 10*3/uL (ref 0.1–0.9)
Monocytes: 13 %
NEUTROS ABS: 3.3 10*3/uL (ref 1.4–7.0)
Neutrophils: 51 %
Platelets: 363 10*3/uL (ref 150–379)
RBC: 5.1 x10E6/uL (ref 4.14–5.80)
RDW: 13.5 % (ref 12.3–15.4)
WBC: 6.4 10*3/uL (ref 3.4–10.8)

## 2017-03-07 LAB — BMP8+EGFR
BUN/Creatinine Ratio: 16 (ref 9–20)
BUN: 20 mg/dL (ref 6–24)
CO2: 27 mmol/L (ref 20–29)
CREATININE: 1.29 mg/dL — AB (ref 0.76–1.27)
Calcium: 9 mg/dL (ref 8.7–10.2)
Chloride: 95 mmol/L — ABNORMAL LOW (ref 96–106)
GFR, EST AFRICAN AMERICAN: 71 mL/min/{1.73_m2} (ref >59)
GFR, EST NON AFRICAN AMERICAN: 61 mL/min/{1.73_m2} (ref >59)
Glucose: 95 mg/dL (ref 65–99)
Potassium: 3.2 mmol/L — ABNORMAL LOW (ref 3.5–5.2)
SODIUM: 139 mmol/L (ref 134–144)

## 2017-03-07 LAB — THYROID PANEL WITH TSH
Free Thyroxine Index: 2.3 (ref 1.2–4.9)
T3 UPTAKE RATIO: 32 % (ref 24–39)
T4 TOTAL: 7.3 ug/dL (ref 4.5–12.0)
TSH: 0.447 u[IU]/mL — ABNORMAL LOW (ref 0.450–4.500)

## 2017-03-07 NOTE — Telephone Encounter (Signed)
Patient states he has been constipated for 1 day. Has not tried anything over the counter. Aware to try otw and will let us know if it does not work.

## 2017-03-07 NOTE — Telephone Encounter (Signed)
Attempted to contact patient - NVM 

## 2017-03-10 ENCOUNTER — Telehealth: Payer: Self-pay | Admitting: Family Medicine

## 2017-03-10 NOTE — Telephone Encounter (Signed)
Pt has tried the powder  OTC medication. Needs something that is going to help sent to the pharmacy please advise Pt uses [facility]

## 2017-09-25 ENCOUNTER — Encounter: Payer: Self-pay | Admitting: Family Medicine

## 2017-09-25 ENCOUNTER — Ambulatory Visit (INDEPENDENT_AMBULATORY_CARE_PROVIDER_SITE_OTHER): Payer: Managed Care, Other (non HMO) | Admitting: Family Medicine

## 2017-09-25 VITALS — BP 127/64 | HR 80 | Temp 97.5°F | Ht 64.0 in | Wt 223.2 lb

## 2017-09-25 DIAGNOSIS — Z0000 Encounter for general adult medical examination without abnormal findings: Secondary | ICD-10-CM

## 2017-09-25 DIAGNOSIS — I10 Essential (primary) hypertension: Secondary | ICD-10-CM

## 2017-09-25 MED ORDER — HYDROCHLOROTHIAZIDE 12.5 MG PO CAPS: 12.5000 mg | ORAL_CAPSULE | Freq: Every day | ORAL | 3 refills | Status: DC

## 2017-09-25 NOTE — Patient Instructions (Signed)
Great to see you!   Health Maintenance, Male A healthy lifestyle and preventive care is important for your health and wellness. Ask your health care provider about what schedule of regular examinations is right for you. What should I know about weight and diet? Eat a Healthy Diet  Eat plenty of vegetables, fruits, whole grains, low-fat dairy products, and lean protein.  Do not eat a lot of foods high in solid fats, added sugars, or salt.  Maintain a Healthy Weight Regular exercise can help you achieve or maintain a healthy weight. You should:  Do at least 150 minutes of exercise each week. The exercise should increase your heart rate and make you sweat (moderate-intensity exercise).  Do strength-training exercises at least twice a week.  Watch Your Levels of Cholesterol and Blood Lipids  Have your blood tested for lipids and cholesterol every 5 years starting at 59 years of age. If you are at high risk for heart disease, you should start having your blood tested when you are 59 years old. You may need to have your cholesterol levels checked more often if: ? Your lipid or cholesterol levels are high. ? You are older than 59 years of age. ? You are at high risk for heart disease.  What should I know about cancer screening? Many types of cancers can be detected early and may often be prevented. Lung Cancer  You should be screened every year for lung cancer if: ? You are a current smoker who has smoked for at least 30 years. ? You are a former smoker who has quit within the past 15 years.  Talk to your health care provider about your screening options, when you should start screening, and how often you should be screened.  Colorectal Cancer  Routine colorectal cancer screening usually begins at 59 years of age and should be repeated every 5-10 years until you are 59 years old. You may need to be screened more often if early forms of precancerous polyps or small growths are found.  Your health care provider may recommend screening at an earlier age if you have risk factors for colon cancer.  Your health care provider may recommend using home test kits to check for hidden blood in the stool.  A small camera at the end of a tube can be used to examine your colon (sigmoidoscopy or colonoscopy). This checks for the earliest forms of colorectal cancer.  Prostate and Testicular Cancer  Depending on your age and overall health, your health care provider may do certain tests to screen for prostate and testicular cancer.  Talk to your health care provider about any symptoms or concerns you have about testicular or prostate cancer.  Skin Cancer  Check your skin from head to toe regularly.  Tell your health care provider about any new moles or changes in moles, especially if: ? There is a change in a mole's size, shape, or color. ? You have a mole that is larger than a pencil eraser.  Always use sunscreen. Apply sunscreen liberally and repeat throughout the day.  Protect yourself by wearing long sleeves, pants, a wide-brimmed hat, and sunglasses when outside.  What should I know about heart disease, diabetes, and high blood pressure?  If you are 18-39 years of age, have your blood pressure checked every 3-5 years. If you are 40 years of age or older, have your blood pressure checked every year. You should have your blood pressure measured twice-once when you are at a   hospital or clinic, and once when you are not at a hospital or clinic. Record the average of the two measurements. To check your blood pressure when you are not at a hospital or clinic, you can use: ? An automated blood pressure machine at a pharmacy. ? A home blood pressure monitor.  Talk to your health care provider about your target blood pressure.  If you are between 45-79 years old, ask your health care provider if you should take aspirin to prevent heart disease.  Have regular diabetes screenings by  checking your fasting blood sugar level. ? If you are at a normal weight and have a low risk for diabetes, have this test once every three years after the age of 45. ? If you are overweight and have a high risk for diabetes, consider being tested at a younger age or more often.  A one-time screening for abdominal aortic aneurysm (AAA) by ultrasound is recommended for men aged 65-75 years who are current or former smokers. What should I know about preventing infection? Hepatitis B If you have a higher risk for hepatitis B, you should be screened for this virus. Talk with your health care provider to find out if you are at risk for hepatitis B infection. Hepatitis C Blood testing is recommended for:  Everyone born from 1945 through 1965.  Anyone with known risk factors for hepatitis C.  Sexually Transmitted Diseases (STDs)  You should be screened each year for STDs including gonorrhea and chlamydia if: ? You are sexually active and are younger than 59 years of age. ? You are older than 59 years of age and your health care provider tells you that you are at risk for this type of infection. ? Your sexual activity has changed since you were last screened and you are at an increased risk for chlamydia or gonorrhea. Ask your health care provider if you are at risk.  Talk with your health care provider about whether you are at high risk of being infected with HIV. Your health care provider may recommend a prescription medicine to help prevent HIV infection.  What else can I do?  Schedule regular health, dental, and eye exams.  Stay current with your vaccines (immunizations).  Do not use any tobacco products, such as cigarettes, chewing tobacco, and e-cigarettes. If you need help quitting, ask your health care provider.  Limit alcohol intake to no more than 2 drinks per day. One drink equals 12 ounces of beer, 5 ounces of wine, or 1 ounces of hard liquor.  Do not use street drugs.  Do not  share needles.  Ask your health care provider for help if you need support or information about quitting drugs.  Tell your health care provider if you often feel depressed.  Tell your health care provider if you have ever been abused or do not feel safe at home. This information is not intended to replace advice given to you by your health care provider. Make sure you discuss any questions you have with your health care provider. Document Released: 03/07/2008 Document Revised: 05/08/2016 Document Reviewed: 06/13/2015 Elsevier Interactive Patient Education  2018 [employer]  

## 2017-09-25 NOTE — Progress Notes (Signed)
   HPI  Patient presents today for an annual physical exam.  Patient feels healthy and has no complaints. He is occupationally active but has no formal exercise routine. He does watch his diet. He is not fasting today.  Hypertension Good medication compliance.  We reviewed his previous colonoscopy  PMH: Smoking status noted ROS: Per HPI  Objective: BP 127/64   Pulse 80   Temp (!) 97.5 F (36.4 C) (Oral)   Ht 5\' 4"  (1.626 m)   Wt 223 lb 3.2 oz (101.2 kg)   BMI 38.31 kg/m  Gen: NAD, alert, cooperative with exam HEENT: NCAT, EOMI, PERRL, oropharynx moist and clear, nares clear, TMs normal bilaterally CV: RRR, good S1/S2, no murmur Resp: CTABL, no wheezes, non-labored Abd: SNTND, BS present, no guarding or organomegaly Ext: No edema, warm Neuro: Alert and oriented, No gross deficits  Assessment and plan:  #Annual physical exam Normal exam except for weight, discussed therapeutic lifestyle changes Labs today, nonfasting  #Hypertension Well-controlled on HCTZ, no changes, refilled Labs    Meds ordered this encounter  Medications  . hydrochlorothiazide (MICROZIDE) 12.5 MG capsule    Sig: Take 1 capsule (12.5 mg total) by mouth daily.    Dispense:  90 capsule    Refill:  [place], MD [place] 09/25/2017, 2:37 PM

## 2017-09-26 LAB — CBC WITH DIFFERENTIAL/PLATELET
BASOS ABS: 0 10*3/uL (ref 0.0–0.2)
BASOS: 0 %
EOS (ABSOLUTE): 0.2 10*3/uL (ref 0.0–0.4)
EOS: 3 %
HEMATOCRIT: 44.7 % (ref 37.5–51.0)
HEMOGLOBIN: 15 g/dL (ref 13.0–17.7)
IMMATURE GRANS (ABS): 0 10*3/uL (ref 0.0–0.1)
Immature Granulocytes: 0 %
LYMPHS ABS: 2 10*3/uL (ref 0.7–3.1)
LYMPHS: 31 %
MCH: 29.9 pg (ref 26.6–33.0)
MCHC: 33.6 g/dL (ref 31.5–35.7)
MCV: 89 fL (ref 79–97)
Monocytes Absolute: 0.4 10*3/uL (ref 0.1–0.9)
Monocytes: 6 %
NEUTROS ABS: 3.8 10*3/uL (ref 1.4–7.0)
Neutrophils: 60 %
Platelets: 321 10*3/uL (ref 150–379)
RBC: 5.02 x10E6/uL (ref 4.14–5.80)
RDW: 13.8 % (ref 12.3–15.4)
WBC: 6.4 10*3/uL (ref 3.4–10.8)

## 2017-09-26 LAB — LIPID PANEL
CHOL/HDL RATIO: 2.9 ratio (ref 0.0–5.0)
Cholesterol, Total: 178 mg/dL (ref 100–199)
HDL: 61 mg/dL (ref >39)
LDL CALC: 91 mg/dL (ref 0–99)
Triglycerides: 128 mg/dL (ref 0–149)
VLDL CHOLESTEROL CAL: 26 mg/dL (ref 5–40)

## 2017-09-26 LAB — CMP14+EGFR
ALBUMIN: 4.3 g/dL (ref 3.5–5.5)
ALT: 27 IU/L (ref 0–44)
AST: 34 IU/L (ref 0–40)
Albumin/Globulin Ratio: 1.7 (ref 1.2–2.2)
Alkaline Phosphatase: 62 IU/L (ref 39–117)
BILIRUBIN TOTAL: 0.3 mg/dL (ref 0.0–1.2)
BUN / CREAT RATIO: 12 (ref 9–20)
BUN: 13 mg/dL (ref 6–24)
CALCIUM: 9.5 mg/dL (ref 8.7–10.2)
CHLORIDE: 100 mmol/L (ref 96–106)
CO2: 24 mmol/L (ref 20–29)
CREATININE: 1.13 mg/dL (ref 0.76–1.27)
GFR calc Af Amer: 82 mL/min/{1.73_m2} (ref >59)
GFR, EST NON AFRICAN AMERICAN: 71 mL/min/{1.73_m2} (ref >59)
GLUCOSE: 119 mg/dL — AB (ref 65–99)
Globulin, Total: 2.6 g/dL (ref 1.5–4.5)
Potassium: 3.5 mmol/L (ref 3.5–5.2)
Sodium: 142 mmol/L (ref 134–144)
Total Protein: 6.9 g/dL (ref 6.0–8.5)

## 2017-09-26 LAB — PSA: PROSTATE SPECIFIC AG, SERUM: 1.2 ng/mL (ref 0.0–4.0)

## 2017-09-26 LAB — TSH: TSH: 0.655 u[IU]/mL (ref 0.450–4.500)

## 2018-04-10 IMAGING — DX DG CHEST 2V
2 series · 2 of 2 positions shown · non-contrast
Comparison: None in PACs

CLINICAL DATA: Four days of hiccups, fever

EXAM:
CHEST  2 VIEW

[chest pa]
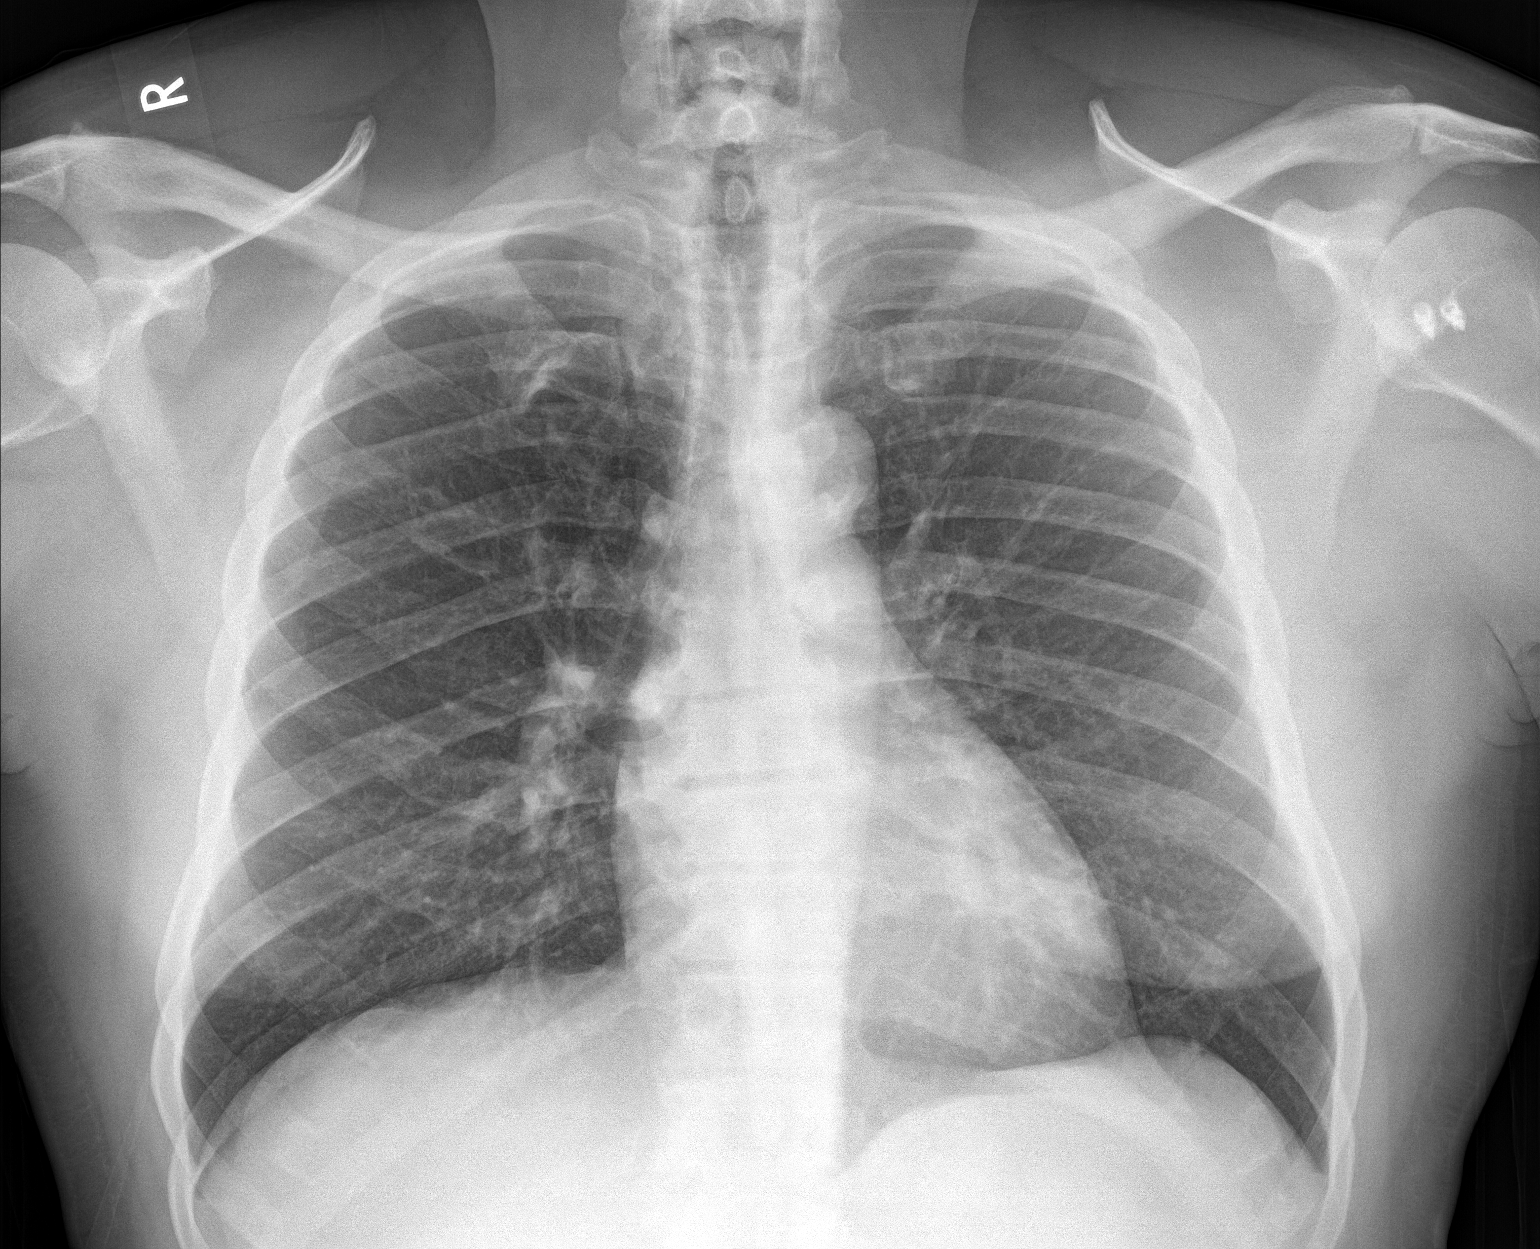

[chest lat]
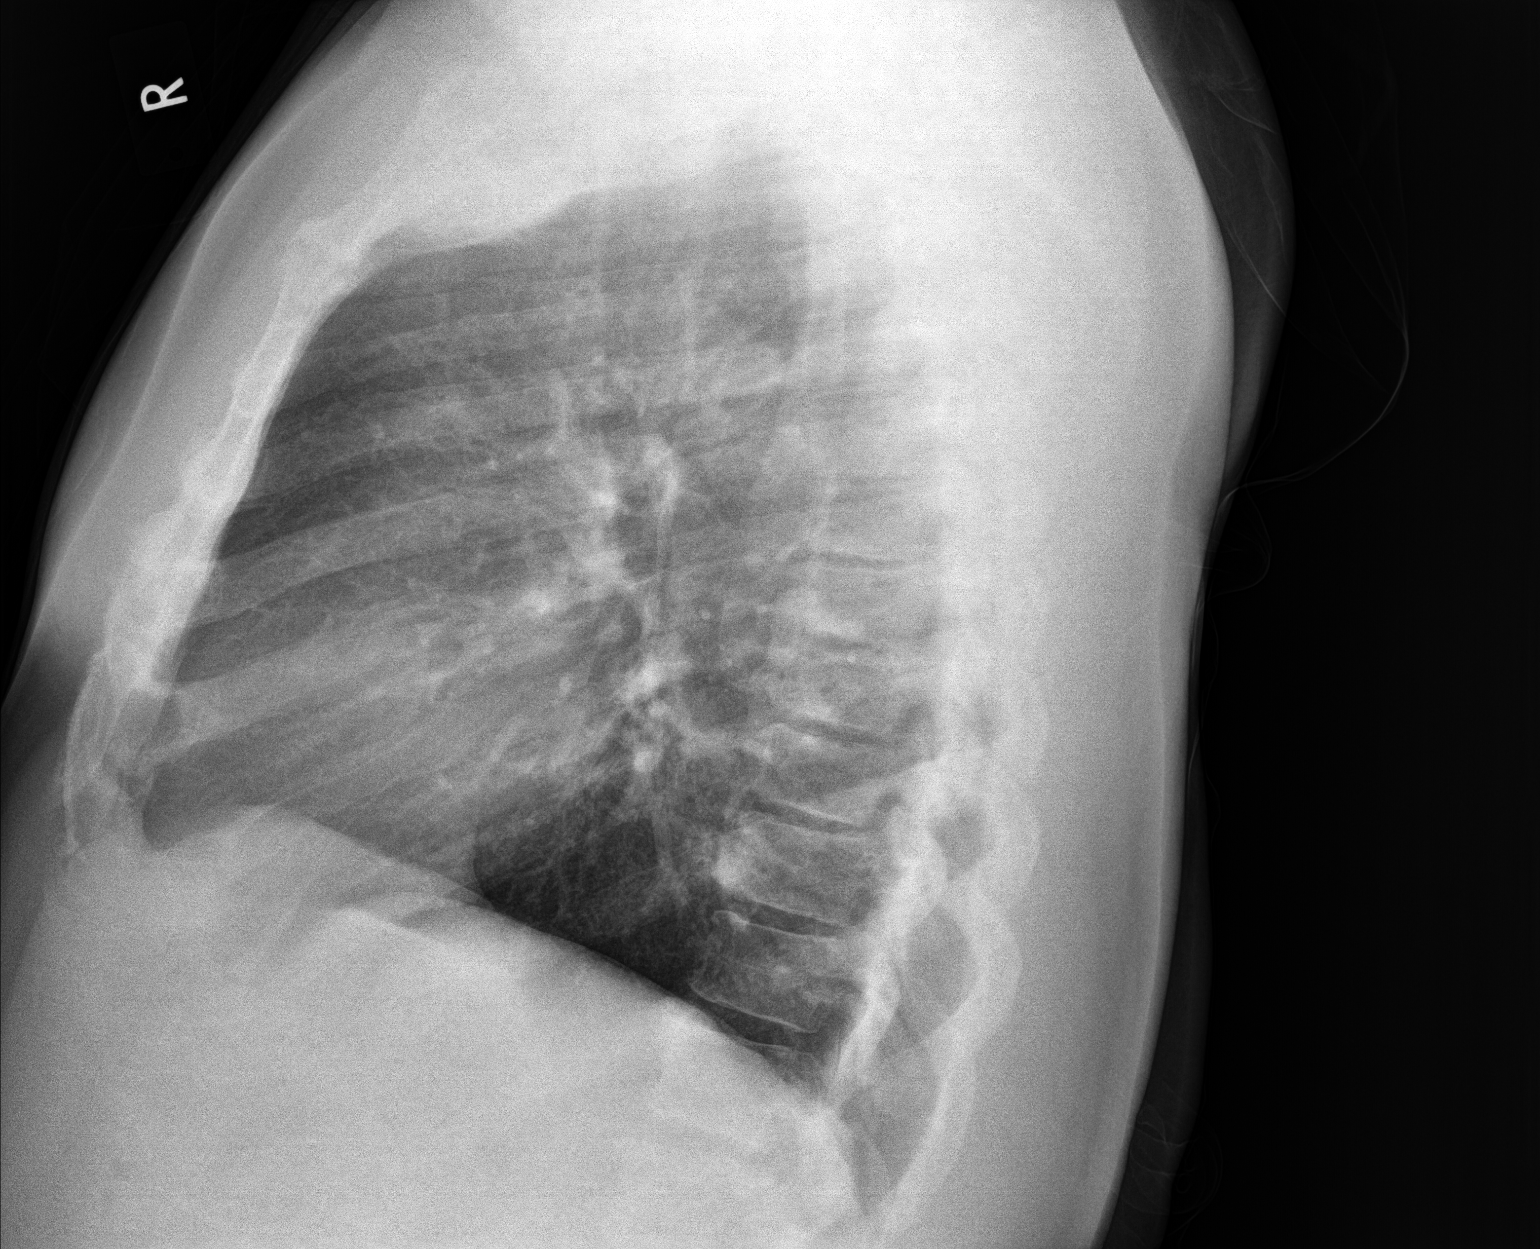

[2 of 2 positions shown; findings below may reference images not displayed]

FINDINGS: The lungs are adequately inflated. There is no focal infiltrate. The
interstitial markings are coarse. The heart and pulmonary
vascularity are normal. The mediastinum is normal in width. There is
calcification in the wall of the thoracic aorta. There is no pleural
effusion. There are sclerotic foci projecting over the mid and lower
thoracic spine which may reflect the presence of osteophytes but and
transient bony lesions or densities in the overlying lung parenchyma
could produce this finding. There are no previous studies with which
to compare.
IMPRESSION: Mild interstitial prominence may reflect the patient's smoking
history. No definite acute pneumonia nor CHF.

If the patient's fever and/or hiccups remain unexplained, chest CT
scanning would be a useful next imaging step.

Thoracic aortic atherosclerosis.

## 2018-06-29 ENCOUNTER — Encounter: Payer: Self-pay | Admitting: Family Medicine

## 2018-06-29 ENCOUNTER — Ambulatory Visit (INDEPENDENT_AMBULATORY_CARE_PROVIDER_SITE_OTHER): Payer: Managed Care, Other (non HMO) | Admitting: Family Medicine

## 2018-06-29 VITALS — BP 136/67 | HR 70 | Temp 97.4°F | Ht 64.0 in | Wt 207.0 lb

## 2018-06-29 DIAGNOSIS — N529 Male erectile dysfunction, unspecified: Secondary | ICD-10-CM

## 2018-06-29 DIAGNOSIS — Z23 Encounter for immunization: Secondary | ICD-10-CM | POA: Diagnosis not present

## 2018-06-29 DIAGNOSIS — Z6838 Body mass index (BMI) 38.0-38.9, adult: Secondary | ICD-10-CM

## 2018-06-29 DIAGNOSIS — I10 Essential (primary) hypertension: Secondary | ICD-10-CM

## 2018-06-29 MED ORDER — HYDROCHLOROTHIAZIDE 12.5 MG PO CAPS: 12.5000 mg | ORAL_CAPSULE | Freq: Every day | ORAL | 3 refills | Status: DC

## 2018-06-29 MED ORDER — CYCLOBENZAPRINE HCL 10 MG PO TABS: 10.0000 mg | ORAL_TABLET | Freq: Three times a day (TID) | ORAL | 5 refills | Status: DC | PRN

## 2018-06-29 MED ORDER — SILDENAFIL CITRATE 20 MG PO TABS: 20.0000 mg | ORAL_TABLET | ORAL | 0 refills | Status: DC | PRN

## 2018-06-29 NOTE — Progress Notes (Signed)
BP 136/67   Pulse 70   Temp (!) 97.4 F (36.3 C) (Oral)   Ht 5' 4"  (1.626 m)   Wt 207 lb (93.9 kg)   BMI 35.53 kg/m    Subjective:    Patient ID: Michael Carrillo, male    DOB: [birth year], 57 y.o.   MRN: [id number]  HPI: Michael Carrillo is a 57 y.o. male presenting on 06/29/2018 for Hypertension (Check up of chronic medical conditions) and Establish Care Wendi Snipes pt)   HPI Hypertension Patient is currently on hydrochlorthiazide, and their blood pressure today is 136/67. Patient denies any lightheadedness or dizziness. Patient denies headaches, blurred vision, chest pains, shortness of breath, or weakness. Denies any side effects from medication and is content with current medication.   Erectile dysfunction Patient comes in today and is been having problems with erectile dysfunction over the past month and a half.  He says he and his wife are usually sexually active and over the past month and a half he is able to start an erection but cannot sustain it and sometimes cannot finish.  He is wondering if there is anything that can help with him for that.  He denies any pain or blood with ejaculation or urination.  He denies any urinary issues.  He says he is never had this before until just recently.  Issues with anxiety or depression or major stress currently.  Relevant past medical, surgical, family and social history reviewed and updated as indicated. Interim medical history since our last visit reviewed. Allergies and medications reviewed and updated.  Review of Systems  Constitutional: Negative for chills and fever.  Eyes: Negative for visual disturbance.  Respiratory: Negative for shortness of breath and wheezing.   Cardiovascular: Negative for chest pain and leg swelling.  Musculoskeletal: Negative for back pain and gait problem.  Skin: Negative for rash.  Neurological: Negative for dizziness, weakness, light-headedness, numbness and headaches.  All other systems reviewed and  are negative.   Per HPI unless specifically indicated above   Allergies as of 06/29/2018   No Known Allergies     Medication List        Accurate as of 06/29/18  4:21 PM. Always use your most recent med list.          aspirin EC 81 MG tablet Take 81 mg by mouth daily.   chlorproMAZINE 25 MG tablet Commonly known as:  THORAZINE Take 1 tablet (25 mg total) by mouth 3 (three) times daily.   cyclobenzaprine 10 MG tablet Commonly known as:  FLEXERIL Take 1 tablet (10 mg total) by mouth 3 (three) times daily as needed for muscle spasms.   etodolac 400 MG tablet Commonly known as:  LODINE   fluticasone 50 MCG/ACT nasal spray Commonly known as:  FLONASE Place 1 spray into both nostrils 2 (two) times daily as needed for allergies or rhinitis.   hydrochlorothiazide 12.5 MG capsule Commonly known as:  MICROZIDE Take 1 capsule (12.5 mg total) by mouth daily.   metoCLOPramide 10 MG tablet Commonly known as:  REGLAN Take 1 tablet (10 mg total) by mouth every 8 (eight) hours as needed for nausea.   Na Sulfate-K Sulfate-Mg Sulf 17.5-3.13-1.6 GM/177ML Soln Take 1 kit by mouth as directed.   ondansetron 4 MG tablet Commonly known as:  ZOFRAN   sildenafil 20 MG tablet Commonly known as:  REVATIO Take 1-4 tablets (20-80 mg total) by mouth as needed.          Objective:  BP 136/67   Pulse 70   Temp (!) 97.4 F (36.3 C) (Oral)   Ht 5' 4"  (1.626 m)   Wt 207 lb (93.9 kg)   BMI 35.53 kg/m   Wt Readings from Last 3 Encounters:  06/29/18 207 lb (93.9 kg)  09/25/17 223 lb 3.2 oz (101.2 kg)  03/06/17 210 lb 12.8 oz (95.6 kg)    Physical Exam  Constitutional: He is oriented to person, place, and time. He appears well-developed and well-nourished. No distress.  Eyes: Conjunctivae are normal. No scleral icterus.  Neck: Neck supple. No thyromegaly present.  Cardiovascular: Normal rate, regular rhythm, normal heart sounds and intact distal pulses.  No murmur  heard. Pulmonary/Chest: Effort normal and breath sounds normal. No respiratory distress. He has no wheezes.  Abdominal: Soft. Bowel sounds are normal. He exhibits no distension. There is no tenderness. There is no guarding.  Musculoskeletal: Normal range of motion. He exhibits no edema.  Lymphadenopathy:    He has no cervical adenopathy.  Neurological: He is alert and oriented to person, place, and time. Coordination normal.  Skin: Skin is warm and dry. No rash noted. He is not diaphoretic.  Psychiatric: He has a normal mood and affect. His behavior is normal.  Nursing note and vitals reviewed.     Assessment & Plan:   Problem List Items Addressed This Visit      Cardiovascular and Mediastinum   HTN (hypertension) - Primary   Relevant Medications   hydrochlorothiazide (MICROZIDE) 12.5 MG capsule   sildenafil (REVATIO) 20 MG tablet   Other Relevant Orders   CMP14+EGFR     Other   Obesity   Relevant Orders   Lipid panel    Other Visit Diagnoses    Need for immunization against influenza       Relevant Orders   Flu Vaccine QUAD 36+ mos IM (Completed)   Erectile dysfunction, unspecified erectile dysfunction type       Relevant Medications   sildenafil (REVATIO) 20 MG tablet      Follow up plan: Return in about 6 months (around 12/29/2018), or if symptoms worsen or fail to improve, for Recheck hypertension.  Counseling provided for all of the vaccine components Orders Placed This Encounter  Procedures  . Flu Vaccine QUAD 36+ mos IM  . CMP14+EGFR  . Lipid panel    Caryl Pina, MD [facility] 06/29/2018, 4:21 PM

## 2018-06-30 LAB — CMP14+EGFR
ALK PHOS: 52 IU/L (ref 39–117)
ALT: 26 IU/L (ref 0–44)
AST: 28 IU/L (ref 0–40)
Albumin/Globulin Ratio: 2 (ref 1.2–2.2)
Albumin: 4.3 g/dL (ref 3.5–5.5)
BILIRUBIN TOTAL: 0.3 mg/dL (ref 0.0–1.2)
BUN/Creatinine Ratio: 12 (ref 9–20)
BUN: 13 mg/dL (ref 6–24)
CHLORIDE: 106 mmol/L (ref 96–106)
CO2: 25 mmol/L (ref 20–29)
Calcium: 9.2 mg/dL (ref 8.7–10.2)
Creatinine, Ser: 1.06 mg/dL (ref 0.76–1.27)
GFR calc Af Amer: 89 mL/min/{1.73_m2} (ref >59)
GFR, EST NON AFRICAN AMERICAN: 77 mL/min/{1.73_m2} (ref >59)
GLOBULIN, TOTAL: 2.2 g/dL (ref 1.5–4.5)
Glucose: 98 mg/dL (ref 65–99)
POTASSIUM: 3.5 mmol/L (ref 3.5–5.2)
SODIUM: 143 mmol/L (ref 134–144)
Total Protein: 6.5 g/dL (ref 6.0–8.5)

## 2018-06-30 LAB — LIPID PANEL
Chol/HDL Ratio: 2.6 ratio (ref 0.0–5.0)
Cholesterol, Total: 182 mg/dL (ref 100–199)
HDL: 69 mg/dL (ref >39)
LDL Calculated: 99 mg/dL (ref 0–99)
TRIGLYCERIDES: 71 mg/dL (ref 0–149)
VLDL Cholesterol Cal: 14 mg/dL (ref 5–40)

## 2018-10-01 ENCOUNTER — Ambulatory Visit (INDEPENDENT_AMBULATORY_CARE_PROVIDER_SITE_OTHER): Payer: Managed Care, Other (non HMO) | Admitting: Family Medicine

## 2018-10-01 ENCOUNTER — Encounter: Payer: Self-pay | Admitting: Family Medicine

## 2018-10-01 VITALS — BP 132/62 | HR 77 | Temp 98.6°F | Ht 64.0 in | Wt 209.2 lb

## 2018-10-01 DIAGNOSIS — N529 Male erectile dysfunction, unspecified: Secondary | ICD-10-CM | POA: Diagnosis not present

## 2018-10-01 DIAGNOSIS — Z0000 Encounter for general adult medical examination without abnormal findings: Secondary | ICD-10-CM

## 2018-10-01 DIAGNOSIS — I10 Essential (primary) hypertension: Secondary | ICD-10-CM | POA: Diagnosis not present

## 2018-10-01 DIAGNOSIS — Z23 Encounter for immunization: Secondary | ICD-10-CM

## 2018-10-01 DIAGNOSIS — Z0001 Encounter for general adult medical examination with abnormal findings: Secondary | ICD-10-CM

## 2018-10-01 DIAGNOSIS — Z6835 Body mass index (BMI) 35.0-35.9, adult: Secondary | ICD-10-CM

## 2018-10-01 MED ORDER — SILDENAFIL CITRATE 20 MG PO TABS: 20.0000 mg | ORAL_TABLET | ORAL | 0 refills | Status: DC | PRN

## 2018-10-01 NOTE — Addendum Note (Signed)
Addended by: Thana Ates on: 10/01/2018 04:06 PM   Modules accepted: Orders

## 2018-10-01 NOTE — Progress Notes (Signed)
BP 132/62   Pulse 77   Temp 98.6 F (37 C) (Oral)   Ht 5' 4"  (1.626 m)   Wt 209 lb 3.2 oz (94.9 kg)   BMI 35.91 kg/m    Subjective:    Patient ID: Michael Carrillo, male    DOB: 10/14/1960, 58 y.o.   MRN: [id number]  HPI: Michael Carrillo is a 58 y.o. male presenting on 10/01/2018 for Annual Exam   HPI Adult well exam Patient is coming in today for adult well exam and physical and recheck on high blood pressure.  He just had blood work done in October and his blood work looked great.  Patient denies any other health issues.  Patient would like a refill on his Viagra and says that it is working well for him. Patient denies any chest pain, shortness of breath, headaches or vision issues, abdominal complaints, diarrhea, nausea, vomiting, or joint issues.   Relevant past medical, surgical, family and social history reviewed and updated as indicated. Interim medical history since our last visit reviewed. Allergies and medications reviewed and updated.  Review of Systems  Constitutional: Negative for chills and fever.  HENT: Negative for ear pain and tinnitus.   Eyes: Negative for pain.  Respiratory: Negative for cough, shortness of breath and wheezing.   Cardiovascular: Negative for chest pain, palpitations and leg swelling.  Gastrointestinal: Negative for abdominal pain, blood in stool, constipation and diarrhea.  Genitourinary: Negative for dysuria and hematuria.  Musculoskeletal: Negative for back pain and myalgias.  Skin: Negative for rash.  Neurological: Negative for dizziness, weakness and headaches.  Psychiatric/Behavioral: Negative for suicidal ideas.    Per HPI unless specifically indicated above   Allergies as of 10/01/2018   No Known Allergies     Medication List       Accurate as of October 01, 2018  3:30 PM. Always use your most recent med list.        aspirin EC 81 MG tablet Take 81 mg by mouth daily.   chlorproMAZINE 25 MG tablet Commonly known as:   THORAZINE Take 1 tablet (25 mg total) by mouth 3 (three) times daily.   cyclobenzaprine 10 MG tablet Commonly known as:  FLEXERIL Take 1 tablet (10 mg total) by mouth 3 (three) times daily as needed for muscle spasms.   etodolac 400 MG tablet Commonly known as:  LODINE   fluticasone 50 MCG/ACT nasal spray Commonly known as:  FLONASE Place 1 spray into both nostrils 2 (two) times daily as needed for allergies or rhinitis.   hydrochlorothiazide 12.5 MG capsule Commonly known as:  MICROZIDE Take 1 capsule (12.5 mg total) by mouth daily.   metoCLOPramide 10 MG tablet Commonly known as:  REGLAN Take 1 tablet (10 mg total) by mouth every 8 (eight) hours as needed for nausea.   Na Sulfate-K Sulfate-Mg Sulf 17.5-3.13-1.6 GM/177ML Soln Commonly known as:  SUPREP BOWEL PREP KIT Take 1 kit by mouth as directed.   ondansetron 4 MG tablet Commonly known as:  ZOFRAN   sildenafil 20 MG tablet Commonly known as:  REVATIO Take 1-4 tablets (20-80 mg total) by mouth as needed.          Objective:    BP 132/62   Pulse 77   Temp 98.6 F (37 C) (Oral)   Ht 5' 4"  (1.626 m)   Wt 209 lb 3.2 oz (94.9 kg)   BMI 35.91 kg/m   Wt Readings from Last 3 Encounters:  10/01/18 209 lb 3.2 oz (  94.9 kg)  06/29/18 207 lb (93.9 kg)  09/25/17 223 lb 3.2 oz (101.2 kg)    Physical Exam Vitals signs and nursing note reviewed.  Constitutional:      General: He is not in acute distress.    Appearance: He is well-developed. He is not diaphoretic.  HENT:     Right Ear: External ear normal.     Left Ear: External ear normal.     Nose: Nose normal.     Mouth/Throat:     Pharynx: No oropharyngeal exudate.  Eyes:     General: No scleral icterus.       Right eye: No discharge.     Conjunctiva/sclera: Conjunctivae normal.     Pupils: Pupils are equal, round, and reactive to light.  Neck:     Musculoskeletal: Neck supple.     Thyroid: No thyromegaly.  Cardiovascular:     Rate and Rhythm: Normal rate  and regular rhythm.     Heart sounds: Normal heart sounds. No murmur.  Pulmonary:     Effort: Pulmonary effort is normal. No respiratory distress.     Breath sounds: Normal breath sounds. No wheezing.  Abdominal:     General: Bowel sounds are normal. There is no distension.     Palpations: Abdomen is soft.     Tenderness: There is no abdominal tenderness. There is no guarding or rebound.  Musculoskeletal: Normal range of motion.  Lymphadenopathy:     Cervical: No cervical adenopathy.  Skin:    General: Skin is warm and dry.     Findings: No rash.  Neurological:     Mental Status: He is alert and oriented to person, place, and time.     Coordination: Coordination normal.  Psychiatric:        Behavior: Behavior normal.     Results for orders placed or performed in visit on 06/29/18  CMP14+EGFR  Result Value Ref Range   Glucose 98 65 - 99 mg/dL   BUN 13 6 - 24 mg/dL   Creatinine, Ser 1.06 0.76 - 1.27 mg/dL   GFR calc non Af Amer 77 >59 mL/min/1.73   GFR calc Af Amer 89 >59 mL/min/1.73   BUN/Creatinine Ratio 12 9 - 20   Sodium 143 134 - 144 mmol/L   Potassium 3.5 3.5 - 5.2 mmol/L   Chloride 106 96 - 106 mmol/L   CO2 25 20 - 29 mmol/L   Calcium 9.2 8.7 - 10.2 mg/dL   Total Protein 6.5 6.0 - 8.5 g/dL   Albumin 4.3 3.5 - 5.5 g/dL   Globulin, Total 2.2 1.5 - 4.5 g/dL   Albumin/Globulin Ratio 2.0 1.2 - 2.2   Bilirubin Total 0.3 0.0 - 1.2 mg/dL   Alkaline Phosphatase 52 39 - 117 IU/L   AST 28 0 - 40 IU/L   ALT 26 0 - 44 IU/L  Lipid panel  Result Value Ref Range   Cholesterol, Total 182 100 - 199 mg/dL   Triglycerides 71 0 - 149 mg/dL   HDL 69 >39 mg/dL   VLDL Cholesterol Cal 14 5 - 40 mg/dL   LDL Calculated 99 0 - 99 mg/dL   Chol/HDL Ratio 2.6 0.0 - 5.0 ratio      Assessment & Plan:   Problem List Items Addressed This Visit      Cardiovascular and Mediastinum   HTN (hypertension)   Relevant Medications   sildenafil (REVATIO) 20 MG tablet     Other   Obesity  Other Visit Diagnoses    Well adult exam    -  Primary   Erectile dysfunction, unspecified erectile dysfunction type       Relevant Medications   sildenafil (REVATIO) 20 MG tablet       Follow up plan: Return in about 1 year (around 10/02/2019), or if symptoms worsen or fail to improve, for Well adult.  Counseling provided for all of the vaccine components No orders of the defined types were placed in this encounter.   Caryl Pina, MD [facility] 10/01/2018, 3:30 PM

## 2019-04-14 ENCOUNTER — Other Ambulatory Visit: Payer: Self-pay | Admitting: Family Medicine

## 2019-04-14 DIAGNOSIS — N529 Male erectile dysfunction, unspecified: Secondary | ICD-10-CM

## 2019-04-15 ENCOUNTER — Other Ambulatory Visit: Payer: Self-pay | Admitting: Family Medicine

## 2019-04-15 DIAGNOSIS — N529 Male erectile dysfunction, unspecified: Secondary | ICD-10-CM

## 2019-04-20 ENCOUNTER — Other Ambulatory Visit: Payer: Self-pay | Admitting: Family Medicine

## 2019-04-20 DIAGNOSIS — N529 Male erectile dysfunction, unspecified: Secondary | ICD-10-CM

## 2019-04-22 ENCOUNTER — Other Ambulatory Visit: Payer: Self-pay | Admitting: Family Medicine

## 2019-04-22 DIAGNOSIS — N529 Male erectile dysfunction, unspecified: Secondary | ICD-10-CM

## 2019-06-10 ENCOUNTER — Other Ambulatory Visit: Payer: Self-pay | Admitting: Family Medicine

## 2019-06-10 DIAGNOSIS — N529 Male erectile dysfunction, unspecified: Secondary | ICD-10-CM

## 2019-06-11 NOTE — Telephone Encounter (Signed)
Last seen 10/01/2018 last filled 04/15/2019

## 2019-08-22 ENCOUNTER — Other Ambulatory Visit: Payer: Self-pay | Admitting: Family Medicine

## 2019-08-22 DIAGNOSIS — N529 Male erectile dysfunction, unspecified: Secondary | ICD-10-CM

## 2019-10-18 ENCOUNTER — Other Ambulatory Visit: Payer: Self-pay | Admitting: Family Medicine

## 2019-10-18 DIAGNOSIS — N529 Male erectile dysfunction, unspecified: Secondary | ICD-10-CM

## 2019-10-18 NOTE — Telephone Encounter (Signed)
Been a year since seen - [employer] for refills

## 2019-10-20 ENCOUNTER — Other Ambulatory Visit: Payer: Self-pay | Admitting: Family Medicine

## 2019-10-20 DIAGNOSIS — N529 Male erectile dysfunction, unspecified: Secondary | ICD-10-CM

## 2019-12-02 ENCOUNTER — Other Ambulatory Visit: Payer: Self-pay | Admitting: *Deleted

## 2019-12-02 DIAGNOSIS — J209 Acute bronchitis, unspecified: Secondary | ICD-10-CM

## 2019-12-02 NOTE — Telephone Encounter (Signed)
Lm for pt to call for appt

## 2019-12-02 NOTE — Telephone Encounter (Signed)
flonase denied - [employer] ( has been over a year)

## 2020-01-20 ENCOUNTER — Other Ambulatory Visit: Payer: Self-pay

## 2020-01-20 ENCOUNTER — Encounter: Payer: Self-pay | Admitting: Family Medicine

## 2020-01-20 ENCOUNTER — Ambulatory Visit (INDEPENDENT_AMBULATORY_CARE_PROVIDER_SITE_OTHER): Payer: Managed Care, Other (non HMO) | Admitting: Family Medicine

## 2020-01-20 VITALS — BP 149/73 | HR 66 | Temp 98.8°F | Ht 64.0 in | Wt 208.4 lb

## 2020-01-20 DIAGNOSIS — J301 Allergic rhinitis due to pollen: Secondary | ICD-10-CM

## 2020-01-20 DIAGNOSIS — Z114 Encounter for screening for human immunodeficiency virus [HIV]: Secondary | ICD-10-CM

## 2020-01-20 DIAGNOSIS — Z6835 Body mass index (BMI) 35.0-35.9, adult: Secondary | ICD-10-CM

## 2020-01-20 DIAGNOSIS — I10 Essential (primary) hypertension: Secondary | ICD-10-CM | POA: Diagnosis not present

## 2020-01-20 DIAGNOSIS — Z0001 Encounter for general adult medical examination with abnormal findings: Secondary | ICD-10-CM

## 2020-01-20 DIAGNOSIS — Z125 Encounter for screening for malignant neoplasm of prostate: Secondary | ICD-10-CM

## 2020-01-20 DIAGNOSIS — Z0000 Encounter for general adult medical examination without abnormal findings: Secondary | ICD-10-CM

## 2020-01-20 MED ORDER — FLUTICASONE PROPIONATE 50 MCG/ACT NA SUSP: 1.0000 | Freq: Two times a day (BID) | NASAL | 1 refills | Status: DC | PRN

## 2020-01-20 MED ORDER — HYDROCHLOROTHIAZIDE 12.5 MG PO CAPS: 12.5000 mg | ORAL_CAPSULE | Freq: Every day | ORAL | 3 refills | Status: DC

## 2020-01-20 MED ORDER — METOCLOPRAMIDE HCL 10 MG PO TABS: 10.0000 mg | ORAL_TABLET | Freq: Three times a day (TID) | ORAL | 0 refills | Status: DC | PRN

## 2020-01-20 MED ORDER — ONDANSETRON HCL 4 MG PO TABS: 4.0000 mg | ORAL_TABLET | Freq: Three times a day (TID) | ORAL | 3 refills | Status: DC | PRN

## 2020-01-20 MED ORDER — CYCLOBENZAPRINE HCL 10 MG PO TABS: 10.0000 mg | ORAL_TABLET | Freq: Three times a day (TID) | ORAL | 5 refills | Status: AC | PRN

## 2020-01-20 NOTE — Progress Notes (Signed)
BP (!) 149/73   Pulse 66   Temp 98.8 F (37.1 C)   Ht 5' 4"  (1.626 m)   Wt 208 lb 6 oz (94.5 kg)   SpO2 97%   BMI 35.77 kg/m    Subjective:   Patient ID: Michael Carrillo, male    DOB: 26-Feb-1961, 59 y.o.   MRN: [id number]  HPI: Michael Carrillo is a 59 y.o. male presenting on 01/20/2020 for Medical Management of Chronic Issues (CPE) and Flank Pain (left)   HPI Well adult exam Patient comes in today for well adult exam and physical and says he is doing relatively very well.  He is also coming in for recheck of his chronic issues.  He is taking antihypertensive and seems to be doing good on it and blood pressure is 149/73 today.  He also takes some motility agents for his stomach that seem to be doing well.  He be only complaint he has today as he has a left-sided flank pain that he feels he got from pulling something.  He denies any dysuria or bowel issues or hematuria or blood in his stool.  He says this is been bothering about a week and he is wondering if he can get a refill on his Flexeril because it tends to help him when he gets like this.  Relevant past medical, surgical, family and social history reviewed and updated as indicated. Interim medical history since our last visit reviewed. Allergies and medications reviewed and updated.  Review of Systems  Constitutional: Negative for chills and fever.  HENT: Negative for ear pain and tinnitus.   Eyes: Negative for pain.  Respiratory: Negative for cough, shortness of breath and wheezing.   Cardiovascular: Negative for chest pain, palpitations and leg swelling.  Gastrointestinal: Negative for abdominal pain, blood in stool, constipation and diarrhea.  Genitourinary: Negative for dysuria and hematuria.  Musculoskeletal: Negative for back pain, gait problem and myalgias.  Skin: Negative for rash.  Neurological: Negative for dizziness, weakness and headaches.  Psychiatric/Behavioral: Negative for suicidal ideas.  All other systems  reviewed and are negative.   Per HPI unless specifically indicated above   Allergies as of 01/20/2020   No Known Allergies     Medication List       Accurate as of January 20, 2020  2:52 PM. If you have any questions, ask your nurse or doctor.        STOP taking these medications   Na Sulfate-K Sulfate-Mg Sulf 17.5-3.13-1.6 GM/177ML Soln Commonly known as: Suprep Bowel Prep Kit Stopped by: Fransisca Kaufmann Yacine Garriga, MD     TAKE these medications   aspirin EC 81 MG tablet Take 81 mg by mouth daily.   chlorproMAZINE 25 MG tablet Commonly known as: THORAZINE Take 1 tablet (25 mg total) by mouth 3 (three) times daily.   cyclobenzaprine 10 MG tablet Commonly known as: FLEXERIL Take 1 tablet (10 mg total) by mouth 3 (three) times daily as needed for muscle spasms.   etodolac 400 MG tablet Commonly known as: LODINE   fluticasone 50 MCG/ACT nasal spray Commonly known as: Flonase Place 1 spray into both nostrils 2 (two) times daily as needed for allergies or rhinitis.   hydrochlorothiazide 12.5 MG capsule Commonly known as: Microzide Take 1 capsule (12.5 mg total) by mouth daily.   metoCLOPramide 10 MG tablet Commonly known as: Reglan Take 1 tablet (10 mg total) by mouth every 8 (eight) hours as needed for nausea.   ondansetron 4 MG tablet Commonly  known as: ZOFRAN Take 1 tablet (4 mg total) by mouth every 8 (eight) hours as needed for nausea or vomiting. What changed:   how much to take  how to take this  when to take this  reasons to take this Changed by: Fransisca Kaufmann Jazyah Butsch, MD   sildenafil 20 MG tablet Commonly known as: REVATIO TAKE 1 TO 4 TABLETS BY MOUTH AS NEEDED        Objective:   BP (!) 149/73   Pulse 66   Temp 98.8 F (37.1 C)   Ht 5' 4"  (1.626 m)   Wt 208 lb 6 oz (94.5 kg)   SpO2 97%   BMI 35.77 kg/m   Wt Readings from Last 3 Encounters:  01/20/20 208 lb 6 oz (94.5 kg)  10/01/18 209 lb 3.2 oz (94.9 kg)  06/29/18 207 lb (93.9 kg)      Physical Exam Vitals and nursing note reviewed.  Constitutional:      General: He is not in acute distress.    Appearance: He is well-developed. He is not diaphoretic.  Eyes:     General: No scleral icterus.    Extraocular Movements: Extraocular movements intact.     Conjunctiva/sclera: Conjunctivae normal.     Pupils: Pupils are equal, round, and reactive to light.  Neck:     Thyroid: No thyromegaly.  Cardiovascular:     Rate and Rhythm: Normal rate and regular rhythm.     Heart sounds: Normal heart sounds. No murmur.  Pulmonary:     Effort: Pulmonary effort is normal. No respiratory distress.     Breath sounds: Normal breath sounds. No wheezing.  Abdominal:     General: Abdomen is flat. Bowel sounds are normal.  Musculoskeletal:        General: Normal range of motion.     Cervical back: Neck supple.  Lymphadenopathy:     Cervical: No cervical adenopathy.  Skin:    General: Skin is warm and dry.     Findings: No rash.  Neurological:     Mental Status: He is alert and oriented to person, place, and time.     Coordination: Coordination normal.  Psychiatric:        Behavior: Behavior normal.       Assessment & Plan:   Problem List Items Addressed This Visit      Cardiovascular and Mediastinum   HTN (hypertension)   Relevant Medications   hydrochlorothiazide (MICROZIDE) 12.5 MG capsule   Other Relevant Orders   Lipid panel   CBC with Differential/Platelet   CMP14+EGFR     Other   Obesity    Other Visit Diagnoses    Well adult exam    -  Primary   Seasonal allergic rhinitis due to pollen       Patient has sinus congestion as well, Flonase and antihistamine and Mucinex, is not working then pick up the azithromycin.   Relevant Medications   fluticasone (FLONASE) 50 MCG/ACT nasal spray   Encounter for screening for HIV       Relevant Orders   HIV Antibody (routine testing w rflx)   Prostate cancer screening       Relevant Orders   PSA, total and free       Will continue current medication, seems to be doing well, will check blood work.  See back in 1 year Follow up plan: Return in about 1 year (around 01/19/2021), or if symptoms worsen or fail to improve, for Physical exam and  hypertension.  Counseling provided for all of the vaccine components Orders Placed This Encounter  Procedures  . Lipid panel  . CBC with Differential/Platelet  . CMP14+EGFR  . HIV Antibody (routine testing w rflx)  . PSA, total and free    Caryl Pina, MD [facility] 01/20/2020, 2:52 PM

## 2020-01-21 ENCOUNTER — Other Ambulatory Visit: Payer: Self-pay | Admitting: Family Medicine

## 2020-01-21 DIAGNOSIS — N529 Male erectile dysfunction, unspecified: Secondary | ICD-10-CM

## 2020-01-21 LAB — CMP14+EGFR
ALT: 31 IU/L (ref 0–44)
AST: 36 IU/L (ref 0–40)
Albumin/Globulin Ratio: 1.9 (ref 1.2–2.2)
Albumin: 4.4 g/dL (ref 3.8–4.9)
Alkaline Phosphatase: 69 IU/L (ref 39–117)
BUN/Creatinine Ratio: 14 (ref 10–24)
BUN: 16 mg/dL (ref 8–27)
Bilirubin Total: 0.3 mg/dL (ref 0.0–1.2)
CO2: 22 mmol/L (ref 20–29)
Calcium: 9.5 mg/dL (ref 8.6–10.2)
Chloride: 101 mmol/L (ref 96–106)
Creatinine, Ser: 1.13 mg/dL (ref 0.76–1.27)
GFR calc Af Amer: 81 mL/min/{1.73_m2} (ref >59)
GFR calc non Af Amer: 70 mL/min/{1.73_m2} (ref >59)
Globulin, Total: 2.3 g/dL (ref 1.5–4.5)
Glucose: 95 mg/dL (ref 65–99)
Potassium: 4 mmol/L (ref 3.5–5.2)
Sodium: 138 mmol/L (ref 134–144)
Total Protein: 6.7 g/dL (ref 6.0–8.5)

## 2020-01-21 LAB — LIPID PANEL
Chol/HDL Ratio: 2.7 ratio (ref 0.0–5.0)
Cholesterol, Total: 201 mg/dL — ABNORMAL HIGH (ref 100–199)
HDL: 75 mg/dL (ref >39)
LDL Chol Calc (NIH): 114 mg/dL — ABNORMAL HIGH (ref 0–99)
Triglycerides: 68 mg/dL (ref 0–149)
VLDL Cholesterol Cal: 12 mg/dL (ref 5–40)

## 2020-01-21 LAB — CBC WITH DIFFERENTIAL/PLATELET
Basophils Absolute: 0 10*3/uL (ref 0.0–0.2)
Basos: 1 %
EOS (ABSOLUTE): 0.3 10*3/uL (ref 0.0–0.4)
Eos: 5 %
Hematocrit: 47.4 % (ref 37.5–51.0)
Hemoglobin: 15.7 g/dL (ref 13.0–17.7)
Immature Grans (Abs): 0 10*3/uL (ref 0.0–0.1)
Immature Granulocytes: 0 %
Lymphocytes Absolute: 1.9 10*3/uL (ref 0.7–3.1)
Lymphs: 34 %
MCH: 29.4 pg (ref 26.6–33.0)
MCHC: 33.1 g/dL (ref 31.5–35.7)
MCV: 89 fL (ref 79–97)
Monocytes Absolute: 0.4 10*3/uL (ref 0.1–0.9)
Monocytes: 8 %
Neutrophils Absolute: 2.8 10*3/uL (ref 1.4–7.0)
Neutrophils: 52 %
Platelets: 337 10*3/uL (ref 150–450)
RBC: 5.34 x10E6/uL (ref 4.14–5.80)
RDW: 12.8 % (ref 11.6–15.4)
WBC: 5.4 10*3/uL (ref 3.4–10.8)

## 2020-01-21 LAB — PSA, TOTAL AND FREE
PSA, Free Pct: 23.3 %
PSA, Free: 0.35 ng/mL
Prostate Specific Ag, Serum: 1.5 ng/mL (ref 0.0–4.0)

## 2020-01-21 LAB — HIV ANTIBODY (ROUTINE TESTING W REFLEX): HIV Screen 4th Generation wRfx: NONREACTIVE

## 2020-02-23 ENCOUNTER — Other Ambulatory Visit: Payer: Self-pay | Admitting: Family Medicine

## 2020-02-23 DIAGNOSIS — N529 Male erectile dysfunction, unspecified: Secondary | ICD-10-CM

## 2020-02-24 ENCOUNTER — Other Ambulatory Visit: Payer: Self-pay | Admitting: Family Medicine

## 2020-02-24 DIAGNOSIS — N529 Male erectile dysfunction, unspecified: Secondary | ICD-10-CM

## 2020-04-14 ENCOUNTER — Other Ambulatory Visit: Payer: Self-pay | Admitting: Family Medicine

## 2020-04-14 DIAGNOSIS — N529 Male erectile dysfunction, unspecified: Secondary | ICD-10-CM

## 2020-06-12 ENCOUNTER — Other Ambulatory Visit: Payer: Self-pay | Admitting: Family Medicine

## 2020-06-12 DIAGNOSIS — N529 Male erectile dysfunction, unspecified: Secondary | ICD-10-CM

## 2020-09-07 ENCOUNTER — Other Ambulatory Visit: Payer: Self-pay | Admitting: Family Medicine

## 2020-09-07 DIAGNOSIS — N529 Male erectile dysfunction, unspecified: Secondary | ICD-10-CM

## 2020-09-07 NOTE — Telephone Encounter (Signed)
Last office visit 01/20/2020  Last refill 06/13/20, #20, 1 refill

## 2020-09-20 ENCOUNTER — Encounter: Payer: Self-pay | Admitting: *Deleted

## 2020-10-16 ENCOUNTER — Other Ambulatory Visit: Payer: Self-pay | Admitting: Family Medicine

## 2020-10-16 DIAGNOSIS — N529 Male erectile dysfunction, unspecified: Secondary | ICD-10-CM

## 2020-11-14 ENCOUNTER — Other Ambulatory Visit: Payer: Self-pay | Admitting: Family Medicine

## 2020-11-14 DIAGNOSIS — N529 Male erectile dysfunction, unspecified: Secondary | ICD-10-CM

## 2020-12-25 ENCOUNTER — Other Ambulatory Visit: Payer: Self-pay | Admitting: Family Medicine

## 2020-12-25 DIAGNOSIS — N529 Male erectile dysfunction, unspecified: Secondary | ICD-10-CM

## 2021-01-22 ENCOUNTER — Encounter: Payer: Managed Care, Other (non HMO) | Admitting: Family Medicine

## 2021-01-24 ENCOUNTER — Encounter: Payer: Managed Care, Other (non HMO) | Admitting: Family Medicine

## 2021-01-25 ENCOUNTER — Other Ambulatory Visit: Payer: Self-pay | Admitting: Family Medicine

## 2021-01-25 DIAGNOSIS — N529 Male erectile dysfunction, unspecified: Secondary | ICD-10-CM

## 2021-03-02 ENCOUNTER — Other Ambulatory Visit: Payer: Self-pay | Admitting: Family Medicine

## 2021-03-02 DIAGNOSIS — N529 Male erectile dysfunction, unspecified: Secondary | ICD-10-CM

## 2021-03-19 ENCOUNTER — Other Ambulatory Visit: Payer: Self-pay

## 2021-03-19 ENCOUNTER — Ambulatory Visit (INDEPENDENT_AMBULATORY_CARE_PROVIDER_SITE_OTHER): Payer: BC Managed Care – PPO | Admitting: Family Medicine

## 2021-03-19 ENCOUNTER — Encounter: Payer: Self-pay | Admitting: Family Medicine

## 2021-03-19 VITALS — BP 140/62 | HR 69 | Ht 64.0 in | Wt 201.0 lb

## 2021-03-19 DIAGNOSIS — Z0001 Encounter for general adult medical examination with abnormal findings: Secondary | ICD-10-CM

## 2021-03-19 DIAGNOSIS — N529 Male erectile dysfunction, unspecified: Secondary | ICD-10-CM

## 2021-03-19 DIAGNOSIS — R110 Nausea: Secondary | ICD-10-CM

## 2021-03-19 DIAGNOSIS — Z23 Encounter for immunization: Secondary | ICD-10-CM | POA: Diagnosis not present

## 2021-03-19 DIAGNOSIS — Z0000 Encounter for general adult medical examination without abnormal findings: Secondary | ICD-10-CM

## 2021-03-19 DIAGNOSIS — Z136 Encounter for screening for cardiovascular disorders: Secondary | ICD-10-CM | POA: Diagnosis not present

## 2021-03-19 DIAGNOSIS — I10 Essential (primary) hypertension: Secondary | ICD-10-CM | POA: Diagnosis not present

## 2021-03-19 DIAGNOSIS — E669 Obesity, unspecified: Secondary | ICD-10-CM

## 2021-03-19 MED ORDER — SILDENAFIL CITRATE 20 MG PO TABS: ORAL_TABLET | ORAL | 1 refills | Status: DC

## 2021-03-19 MED ORDER — HYDROCHLOROTHIAZIDE 12.5 MG PO CAPS: 12.5000 mg | ORAL_CAPSULE | Freq: Every day | ORAL | 3 refills | Status: DC

## 2021-03-19 MED ORDER — ONDANSETRON HCL 4 MG PO TABS: 4.0000 mg | ORAL_TABLET | Freq: Three times a day (TID) | ORAL | 3 refills | Status: DC | PRN

## 2021-03-19 MED ORDER — SAXENDA 18 MG/3ML ~~LOC~~ SOPN
PEN_INJECTOR | SUBCUTANEOUS | 0 refills | Status: DC
Start: 2021-03-19 — End: 2021-04-17

## 2021-03-19 MED ORDER — METOCLOPRAMIDE HCL 10 MG PO TABS: 10.0000 mg | ORAL_TABLET | Freq: Three times a day (TID) | ORAL | 2 refills | Status: DC | PRN

## 2021-03-19 NOTE — Progress Notes (Signed)
BP 140/62   Pulse 69   Ht 5' 4"  (1.626 m)   Wt 201 lb (91.2 kg)   SpO2 94%   BMI 34.50 kg/m    Subjective:   Patient ID: Michael Carrillo, male    DOB: 06-03-1961, 60 y.o.   MRN: [id number]  HPI: Michael Carrillo is a 60 y.o. male presenting on 03/19/2021 for Medical Management of Chronic Issues and Hypertension   HPI Adult well exam and physical Patient denies any chest pain, shortness of breath, headaches or vision issues, abdominal complaints, diarrhea, nausea, vomiting, or joint issues.   Hypertension Patient is currently on hydrochlorothiazide, and their blood pressure today is 140/62. Patient denies any lightheadedness or dizziness. Patient denies headaches, blurred vision, chest pains, shortness of breath, or weakness. Denies any side effects from medication and is content with current medication.   Erectile dysfunction recheck Patient takes Viagra as needed for erectile dysfunction has been working well.  Denies any major issues with it.  Patient has intermittent chronic nausea and takes medication for it as needed.  Patient has been gaining weight recently wants to try something for weight loss.  Relevant past medical, surgical, family and social history reviewed and updated as indicated. Interim medical history since our last visit reviewed. Allergies and medications reviewed and updated.  Review of Systems  Constitutional:  Negative for chills and fever.  HENT:  Negative for ear pain and tinnitus.   Eyes:  Negative for pain and discharge.  Respiratory:  Negative for cough, shortness of breath and wheezing.   Cardiovascular:  Negative for chest pain, palpitations and leg swelling.  Gastrointestinal:  Negative for abdominal pain, blood in stool, constipation and diarrhea.  Genitourinary:  Negative for dysuria and hematuria.  Musculoskeletal:  Negative for back pain, gait problem and myalgias.  Skin:  Negative for rash.  Neurological:  Negative for dizziness,  weakness and headaches.  Psychiatric/Behavioral:  Negative for suicidal ideas.   All other systems reviewed and are negative.  Per HPI unless specifically indicated above   Allergies as of 03/19/2021   No Known Allergies      Medication List        Accurate as of March 19, 2021  3:03 PM. If you have any questions, ask your nurse or doctor.          aspirin EC 81 MG tablet Take 81 mg by mouth daily.   chlorproMAZINE 25 MG tablet Commonly known as: THORAZINE Take 1 tablet (25 mg total) by mouth 3 (three) times daily.   cyclobenzaprine 10 MG tablet Commonly known as: FLEXERIL Take 1 tablet (10 mg total) by mouth 3 (three) times daily as needed for muscle spasms.   etodolac 400 MG tablet Commonly known as: LODINE   fluticasone 50 MCG/ACT nasal spray Commonly known as: Flonase Place 1 spray into both nostrils 2 (two) times daily as needed for allergies or rhinitis.   hydrochlorothiazide 12.5 MG capsule Commonly known as: Microzide Take 1 capsule (12.5 mg total) by mouth daily.   metoCLOPramide 10 MG tablet Commonly known as: Reglan Take 1 tablet (10 mg total) by mouth every 8 (eight) hours as needed for nausea.   ondansetron 4 MG tablet Commonly known as: ZOFRAN Take 1 tablet (4 mg total) by mouth every 8 (eight) hours as needed for nausea or vomiting.   sildenafil 20 MG tablet Commonly known as: REVATIO TAKE 1 TO 4 TABLETS BY MOUTH AS NEEDED         Objective:  BP 140/62   Pulse 69   Ht 5' 4"  (1.626 m)   Wt 201 lb (91.2 kg)   SpO2 94%   BMI 34.50 kg/m   Wt Readings from Last 3 Encounters:  03/19/21 201 lb (91.2 kg)  01/20/20 208 lb 6 oz (94.5 kg)  10/01/18 209 lb 3.2 oz (94.9 kg)    Physical Exam Vitals and nursing note reviewed.  Constitutional:      General: He is not in acute distress.    Appearance: He is well-developed. He is not diaphoretic.  HENT:     Right Ear: External ear normal.     Left Ear: External ear normal.     Nose: Nose  normal.     Mouth/Throat:     Pharynx: No oropharyngeal exudate.  Eyes:     General: No scleral icterus.       Right eye: No discharge.     Conjunctiva/sclera: Conjunctivae normal.     Pupils: Pupils are equal, round, and reactive to light.  Neck:     Thyroid: No thyromegaly.  Cardiovascular:     Rate and Rhythm: Normal rate and regular rhythm.     Heart sounds: Normal heart sounds. No murmur heard. Pulmonary:     Effort: Pulmonary effort is normal. No respiratory distress.     Breath sounds: Normal breath sounds. No wheezing.  Abdominal:     General: Bowel sounds are normal. There is no distension.     Palpations: Abdomen is soft.     Tenderness: There is no abdominal tenderness. There is no guarding or rebound.  Genitourinary:    Prostate: Normal.     Rectum: Normal.  Musculoskeletal:        General: Normal range of motion.     Cervical back: Neck supple.  Lymphadenopathy:     Cervical: No cervical adenopathy.  Skin:    General: Skin is warm and dry.     Findings: No rash.  Neurological:     Mental Status: He is alert and oriented to person, place, and time.     Coordination: Coordination normal.  Psychiatric:        Behavior: Behavior normal.      Assessment & Plan:   Problem List Items Addressed This Visit       Cardiovascular and Mediastinum   HTN (hypertension)   Relevant Medications   hydrochlorothiazide (MICROZIDE) 12.5 MG capsule   sildenafil (REVATIO) 20 MG tablet   Liraglutide -Weight Management (SAXENDA) 18 MG/3ML SOPN   Other Visit Diagnoses     Well adult exam    -  Primary   Relevant Medications   Liraglutide -Weight Management (SAXENDA) 18 MG/3ML SOPN   Other Relevant Orders   CBC with Differential/Platelet   CMP14+EGFR   Lipid panel   PSA, total and free   Essential hypertension       Relevant Medications   hydrochlorothiazide (MICROZIDE) 12.5 MG capsule   sildenafil (REVATIO) 20 MG tablet   Liraglutide -Weight Management (SAXENDA) 18  MG/3ML SOPN   Erectile dysfunction, unspecified erectile dysfunction type       Relevant Medications   sildenafil (REVATIO) 20 MG tablet   Obesity (BMI 30.0-34.9)       Relevant Medications   Liraglutide -Weight Management (SAXENDA) 18 MG/3ML SOPN   Nausea       Relevant Medications   ondansetron (ZOFRAN) 4 MG tablet   metoCLOPramide (REGLAN) 10 MG tablet       Will try Saxenda and see  if that helps with the weight loss, continue hydrochlorothiazide, no other changes Follow up plan: Return in about 1 year (around 03/19/2022), or if symptoms worsen or fail to improve, for Physical and hypertension..  Counseling provided for all of the vaccine components No orders of the defined types were placed in this encounter.   Caryl Pina, MD [facility] 03/19/2021, 3:03 PM

## 2021-03-19 NOTE — Addendum Note (Signed)
Addended by: Alphonzo Dublin on: 03/19/2021 03:33 PM   Modules accepted: Orders

## 2021-03-20 ENCOUNTER — Telehealth: Payer: Self-pay | Admitting: *Deleted

## 2021-03-20 DIAGNOSIS — I10 Essential (primary) hypertension: Secondary | ICD-10-CM

## 2021-03-20 DIAGNOSIS — Z0000 Encounter for general adult medical examination without abnormal findings: Secondary | ICD-10-CM

## 2021-03-20 DIAGNOSIS — E669 Obesity, unspecified: Secondary | ICD-10-CM

## 2021-03-20 LAB — CMP14+EGFR
ALT: 28 IU/L (ref 0–44)
AST: 32 IU/L (ref 0–40)
Albumin/Globulin Ratio: 2 (ref 1.2–2.2)
Albumin: 4.5 g/dL (ref 3.8–4.8)
Alkaline Phosphatase: 61 IU/L (ref 44–121)
BUN/Creatinine Ratio: 13 (ref 10–24)
BUN: 13 mg/dL (ref 8–27)
Bilirubin Total: 0.5 mg/dL (ref 0.0–1.2)
CO2: 25 mmol/L (ref 20–29)
Calcium: 9.3 mg/dL (ref 8.6–10.2)
Chloride: 106 mmol/L (ref 96–106)
Creatinine, Ser: 1.01 mg/dL (ref 0.76–1.27)
Globulin, Total: 2.2 g/dL (ref 1.5–4.5)
Glucose: 103 mg/dL — ABNORMAL HIGH (ref 65–99)
Potassium: 3.6 mmol/L (ref 3.5–5.2)
Sodium: 141 mmol/L (ref 134–144)
Total Protein: 6.7 g/dL (ref 6.0–8.5)
eGFR: 85 mL/min/{1.73_m2} (ref >59)

## 2021-03-20 LAB — PSA, TOTAL AND FREE
PSA, Free Pct: 22.3 %
PSA, Free: 0.29 ng/mL
Prostate Specific Ag, Serum: 1.3 ng/mL (ref 0.0–4.0)

## 2021-03-20 LAB — CBC WITH DIFFERENTIAL/PLATELET
Basophils Absolute: 0 10*3/uL (ref 0.0–0.2)
Basos: 1 %
EOS (ABSOLUTE): 0.2 10*3/uL (ref 0.0–0.4)
Eos: 3 %
Hematocrit: 45.9 % (ref 37.5–51.0)
Hemoglobin: 15.6 g/dL (ref 13.0–17.7)
Immature Grans (Abs): 0 10*3/uL (ref 0.0–0.1)
Immature Granulocytes: 0 %
Lymphocytes Absolute: 1.7 10*3/uL (ref 0.7–3.1)
Lymphs: 32 %
MCH: 30.4 pg (ref 26.6–33.0)
MCHC: 34 g/dL (ref 31.5–35.7)
MCV: 90 fL (ref 79–97)
Monocytes Absolute: 0.4 10*3/uL (ref 0.1–0.9)
Monocytes: 9 %
Neutrophils Absolute: 2.9 10*3/uL (ref 1.4–7.0)
Neutrophils: 55 %
Platelets: 313 10*3/uL (ref 150–450)
RBC: 5.13 x10E6/uL (ref 4.14–5.80)
RDW: 12.7 % (ref 11.6–15.4)
WBC: 5.2 10*3/uL (ref 3.4–10.8)

## 2021-03-20 LAB — LIPID PANEL
Chol/HDL Ratio: 2.4 ratio (ref 0.0–5.0)
Cholesterol, Total: 186 mg/dL (ref 100–199)
HDL: 77 mg/dL (ref >39)
LDL Chol Calc (NIH): 99 mg/dL (ref 0–99)
Triglycerides: 53 mg/dL (ref 0–149)
VLDL Cholesterol Cal: 10 mg/dL (ref 5–40)

## 2021-03-20 NOTE — Telephone Encounter (Signed)
Michael Carrillo (Key: [id number]) Rx #: [id number] Saxenda 18MG /3ML pen-injectors  Sent to plan ( 15 ml, not 69ml)

## 2021-03-22 NOTE — Telephone Encounter (Signed)
Your prior authorization for Michael Carrillo has been approved! MORE INFO For eligible patients, copay assistance may be available. To learn more and be redirected to the [facility] website, click on the "More Info" button to the right. Please also note that you may need to schedule a follow-up visit with your patient prior to the expiration of this prior authorization, as updated patient weight may be required for reauthorization.  Message from plan: Effective from 03/20/2021 through 07/23/2021.  Kenn[place] in [place] made aware.

## 2021-03-23 ENCOUNTER — Telehealth: Payer: Self-pay | Admitting: Family Medicine

## 2021-04-17 ENCOUNTER — Other Ambulatory Visit: Payer: Self-pay | Admitting: *Deleted

## 2021-04-17 DIAGNOSIS — E669 Obesity, unspecified: Secondary | ICD-10-CM

## 2021-04-17 DIAGNOSIS — Z0000 Encounter for general adult medical examination without abnormal findings: Secondary | ICD-10-CM

## 2021-04-17 DIAGNOSIS — I10 Essential (primary) hypertension: Secondary | ICD-10-CM

## 2021-04-17 MED ORDER — SAXENDA 18 MG/3ML ~~LOC~~ SOPN
3.0000 mg | PEN_INJECTOR | Freq: Every day | SUBCUTANEOUS | 2 refills | Status: AC
Start: 2021-04-17 — End: 2021-05-17

## 2021-05-01 ENCOUNTER — Other Ambulatory Visit: Payer: Self-pay

## 2021-05-01 ENCOUNTER — Encounter: Payer: Self-pay | Admitting: Family Medicine

## 2021-05-01 ENCOUNTER — Ambulatory Visit: Payer: BC Managed Care – PPO | Admitting: Family Medicine

## 2021-05-01 VITALS — BP 118/59 | HR 65 | Temp 97.7°F | Ht 64.0 in | Wt 193.6 lb

## 2021-05-01 DIAGNOSIS — R222 Localized swelling, mass and lump, trunk: Secondary | ICD-10-CM

## 2021-05-01 NOTE — Progress Notes (Signed)
Chief Complaint  Patient presents with   Cyst    On each side of abdomen at [place] injection sites    HPI  Patient presents today for lumps on each side of his abdomen where he has been self injecting Saxenda.  There are painless but he is concerned the represent a reaction to the medication.  He has been using it approximately since his physical on June 27.  PMH: Smoking status noted ROS: Per HPI  Objective: BP (!) 118/59   Pulse 65   Temp 97.7 F (36.5 C)   Ht '5\' 4"'$  (1.626 m)   Wt 193 lb 9.6 oz (87.8 kg)   SpO2 99%   BMI 33.23 kg/m  Gen: NAD, alert, cooperative with exam HEENT: NCAT, EOMI, PERRL CV: RRR, good S1/S2, no murmur Resp: CTABL, no wheezes, non-labored Abd: On each side of the abdomen there are 2 soft rubbery subcutaneous masses they are about 1 x 2 cm each.  They are freely movable under the surface. Neuro: Alert and oriented, No gross deficits  Assessment and plan:  1. Subcutaneous nodule of abdominal wall     Michael Carrillo was reassured that these are harmless.  I would like him to start varying the injection site more.      Follow up as needed.  Claretta Fraise, MD

## 2021-05-21 ENCOUNTER — Other Ambulatory Visit: Payer: Self-pay | Admitting: Family Medicine

## 2021-05-21 DIAGNOSIS — N529 Male erectile dysfunction, unspecified: Secondary | ICD-10-CM

## 2021-06-07 ENCOUNTER — Other Ambulatory Visit: Payer: Self-pay | Admitting: Family Medicine

## 2021-06-07 DIAGNOSIS — N529 Male erectile dysfunction, unspecified: Secondary | ICD-10-CM

## 2021-06-07 NOTE — Telephone Encounter (Signed)
Last office visit 03/19/21 Last refill 05/22/21, #20, no refills

## 2021-06-12 ENCOUNTER — Telehealth: Payer: Self-pay

## 2021-06-12 NOTE — Telephone Encounter (Signed)
Key: [id number]  Need help? Call us at [phone]  Drug Sildenafil Citrate 20MG  tablets Form  [employer] [place] [employer] Electronic Request Form

## 2021-06-15 NOTE — Telephone Encounter (Signed)
Yes just let the patient know that he will have to purchase on his own outside of insurance, use good Rx

## 2021-06-15 NOTE — Telephone Encounter (Signed)
Denied on September 22

## 2021-07-16 ENCOUNTER — Telehealth: Payer: Self-pay | Admitting: Family Medicine

## 2021-07-16 NOTE — Telephone Encounter (Signed)
(  Key: [id number])  Your information has been submitted to [place]. [employer] [place] will review the request and notify you of the determination decision directly, typically within 72 hours of receiving all information.  You will also receive your request decision electronically. To check for an update later, open this request again from your dashboard.  If [employer] has not responded within the specified timeframe or if you have any questions about your PA submission, contact [facility]  directly at [phone].

## 2021-07-24 NOTE — Telephone Encounter (Signed)
PA was canceled on cover my meds, not sure by whom, also med is not on patients current med list.

## 2021-07-31 ENCOUNTER — Encounter: Payer: Self-pay | Admitting: Family Medicine

## 2021-07-31 ENCOUNTER — Ambulatory Visit: Payer: BC Managed Care – PPO | Admitting: Family Medicine

## 2021-07-31 ENCOUNTER — Other Ambulatory Visit: Payer: Self-pay

## 2021-07-31 VITALS — BP 166/80 | HR 76 | Temp 97.5°F | Ht 64.0 in | Wt 192.6 lb

## 2021-07-31 DIAGNOSIS — I10 Essential (primary) hypertension: Secondary | ICD-10-CM

## 2021-07-31 DIAGNOSIS — L309 Dermatitis, unspecified: Secondary | ICD-10-CM | POA: Diagnosis not present

## 2021-07-31 MED ORDER — METHYLPREDNISOLONE 4 MG PO TBPK: ORAL_TABLET | ORAL | 0 refills | Status: DC

## 2021-07-31 MED ORDER — METHYLPREDNISOLONE ACETATE 80 MG/ML IJ SUSP
80.0000 mg | Freq: Once | INTRAMUSCULAR | Status: AC
  Administered 2021-07-31: 80 mg via INTRAMUSCULAR

## 2021-07-31 NOTE — Progress Notes (Signed)
Assessment & Plan:  1. Dermatitis - Encouraged to take OTC antihistamine to help with itching, ice packs may also help - methylPREDNISolone (MEDROL DOSEPAK) 4 MG TBPK tablet; Use as directed.  Dispense: 21 each; Refill: 0 (START TOMORROW) - methylPREDNISolone acetate (DEPO-MEDROL) injection 80 mg  2. Essential hypertension - encouraged to take medications regularly    Follow up plan: Return if symptoms worsen or fail to improve.  Lucile Crater, NP Student  I personally was present during the history, physical exam, and medical decision-making activities of this service and have verified that the service and findings are accurately documented in the nurse practitioner student's note.  Hendricks Limes, MSN, APRN, FNP-C [facility]   Subjective:   Patient ID: Michael Carrillo, male    DOB: 1961/05/19, 60 y.o.   MRN: [id number]  HPI: Michael Carrillo is a 60 y.o. male presenting on 07/31/2021 for Rash (Bilateral under arms & back of legs x 3 days- itching )   He states 3 days ago he washed his car and went to take a shower and noticed these itchy bumps on his right underarm. He states he has tried cortisone cream, calamine lotion, and regular lotion, which has only caused it to spread and did not do much to help with the itching. He states he did not change any deodorants, detergents, or take any baths. He was not exposed to any other chemicals to his knowledge.   He is hypertensive today in the office but he states he has not taken his blood pressure medication yet today.   ROS: Negative unless specifically indicated above in HPI.   Relevant past medical history reviewed and updated as indicated.   Allergies and medications reviewed and updated.   Current Outpatient Medications:    aspirin EC 81 MG tablet, Take 81 mg by mouth daily., Disp: , Rfl:    chlorproMAZINE (THORAZINE) 25 MG tablet, Take 1 tablet (25 mg total) by mouth 3 (three) times daily., Disp: 21  tablet, Rfl: 0   cyclobenzaprine (FLEXERIL) 10 MG tablet, Take 1 tablet (10 mg total) by mouth 3 (three) times daily as needed for muscle spasms., Disp: 60 tablet, Rfl: 5   etodolac (LODINE) 400 MG tablet, , Disp: , Rfl:    fluticasone (FLONASE) 50 MCG/ACT nasal spray, Place 1 spray into both nostrils 2 (two) times daily as needed for allergies or rhinitis., Disp: 16 g, Rfl: 1   hydrochlorothiazide (MICROZIDE) 12.5 MG capsule, Take 1 capsule (12.5 mg total) by mouth daily., Disp: 90 capsule, Rfl: 3   methylPREDNISolone (MEDROL DOSEPAK) 4 MG TBPK tablet, Use as directed., Disp: 21 each, Rfl: 0   metoCLOPramide (REGLAN) 10 MG tablet, Take 1 tablet (10 mg total) by mouth every 8 (eight) hours as needed for nausea., Disp: 21 tablet, Rfl: 2   ondansetron (ZOFRAN) 4 MG tablet, Take 1 tablet (4 mg total) by mouth every 8 (eight) hours as needed for nausea or vomiting., Disp: 20 tablet, Rfl: 3   sildenafil (REVATIO) 20 MG tablet, TAKE 1 TO 4 TABLETS BY MOUTH AS NEEDED, Disp: 20 tablet, Rfl: 5 No current facility-administered medications for this visit.  Facility-Administered Medications Ordered in Other Visits:    promethazine (PHENERGAN) injection 25 mg, 25 mg, Intravenous, Once, Fields, Sandi L, MD  No Known Allergies  Objective:   BP (!) 166/80   Pulse 76   Temp (!) 97.5 F (36.4 C) (Temporal)   Ht 5\' 4"  (1.626 m)   Wt 87.4 kg  BMI 33.06 kg/m    Physical Exam Vitals reviewed.  Constitutional:      General: He is not in acute distress.    Appearance: Normal appearance. He is not ill-appearing, toxic-appearing or diaphoretic.  HENT:     Head: Normocephalic and atraumatic.  Eyes:     General: No scleral icterus.       Right eye: No discharge.        Left eye: No discharge.     Conjunctiva/sclera: Conjunctivae normal.  Cardiovascular:     Rate and Rhythm: Normal rate and regular rhythm.     Heart sounds: Normal heart sounds. No murmur heard.   No friction rub. No gallop.   Pulmonary:     Effort: Pulmonary effort is normal. No respiratory distress.     Breath sounds: Normal breath sounds. No stridor. No wheezing, rhonchi or rales.  Musculoskeletal:        General: Normal range of motion.     Cervical back: Normal range of motion.     Right lower leg: No edema.     Left lower leg: No edema.  Skin:    General: Skin is warm and dry.     Findings: Rash present.     Comments: Raised, clustered lesions without erythema or drainage, bilateral underarms  Neurological:     Mental Status: He is alert and oriented to person, place, and time. Mental status is at baseline.  Psychiatric:        Mood and Affect: Mood normal.        Behavior: Behavior normal.        Thought Content: Thought content normal.        Judgment: Judgment normal.

## 2021-09-28 DIAGNOSIS — D171 Benign lipomatous neoplasm of skin and subcutaneous tissue of trunk: Secondary | ICD-10-CM | POA: Diagnosis not present

## 2021-12-01 DIAGNOSIS — H04123 Dry eye syndrome of bilateral lacrimal glands: Secondary | ICD-10-CM | POA: Diagnosis not present

## 2021-12-01 DIAGNOSIS — H40033 Anatomical narrow angle, bilateral: Secondary | ICD-10-CM | POA: Diagnosis not present

## 2021-12-27 ENCOUNTER — Other Ambulatory Visit: Payer: Self-pay | Admitting: Family Medicine

## 2021-12-27 DIAGNOSIS — J301 Allergic rhinitis due to pollen: Secondary | ICD-10-CM

## 2022-01-01 ENCOUNTER — Telehealth: Payer: Self-pay | Admitting: Family Medicine

## 2022-01-01 NOTE — Telephone Encounter (Signed)
(  Key: [id number]) PA in process for fluticasone ? ?Your information has been submitted to [place]. [employer] [place] will review the request and notify you of the determination decision directly, typically within 72 hours of receiving all information. ? ?You will also receive your request decision electronically. To check for an update later, open this request again from your dashboard. ? ?If [employer] [place] has not responded within the specified timeframe or if you have any questions about your PA submission, contact [place] directly at [phone]. ?

## 2022-01-02 NOTE — Telephone Encounter (Signed)
Deniedtoday ?Your request has been denied ? ?May need to buy OTC  ?

## 2022-01-07 NOTE — Telephone Encounter (Signed)
Please let the patient know that he may just have to pick up the Flonase over-the-counter, it does not look like his insurance wants to cover it. ?

## 2022-01-07 NOTE — Telephone Encounter (Signed)
Pt has been informed and understood. He has no concerns. ?

## 2022-03-21 ENCOUNTER — Encounter: Payer: BC Managed Care – PPO | Admitting: Family Medicine

## 2022-04-05 ENCOUNTER — Encounter: Payer: BC Managed Care – PPO | Admitting: Family Medicine

## 2022-04-25 ENCOUNTER — Encounter: Payer: BC Managed Care – PPO | Admitting: Family Medicine

## 2022-05-23 ENCOUNTER — Ambulatory Visit (INDEPENDENT_AMBULATORY_CARE_PROVIDER_SITE_OTHER): Payer: BC Managed Care – PPO | Admitting: Family Medicine

## 2022-05-23 ENCOUNTER — Encounter: Payer: Self-pay | Admitting: Family Medicine

## 2022-05-23 VITALS — BP 140/70 | HR 55 | Temp 98.0°F | Ht 64.0 in | Wt 199.0 lb

## 2022-05-23 DIAGNOSIS — N529 Male erectile dysfunction, unspecified: Secondary | ICD-10-CM | POA: Diagnosis not present

## 2022-05-23 DIAGNOSIS — Z0001 Encounter for general adult medical examination with abnormal findings: Secondary | ICD-10-CM

## 2022-05-23 DIAGNOSIS — Z6835 Body mass index (BMI) 35.0-35.9, adult: Secondary | ICD-10-CM

## 2022-05-23 DIAGNOSIS — I10 Essential (primary) hypertension: Secondary | ICD-10-CM | POA: Diagnosis not present

## 2022-05-23 DIAGNOSIS — Z0000 Encounter for general adult medical examination without abnormal findings: Secondary | ICD-10-CM

## 2022-05-23 DIAGNOSIS — Z125 Encounter for screening for malignant neoplasm of prostate: Secondary | ICD-10-CM | POA: Diagnosis not present

## 2022-05-23 DIAGNOSIS — Z23 Encounter for immunization: Secondary | ICD-10-CM

## 2022-05-23 DIAGNOSIS — R110 Nausea: Secondary | ICD-10-CM

## 2022-05-23 MED ORDER — SILDENAFIL CITRATE 20 MG PO TABS: ORAL_TABLET | ORAL | 5 refills | Status: AC

## 2022-05-23 MED ORDER — ONDANSETRON HCL 4 MG PO TABS: 4.0000 mg | ORAL_TABLET | Freq: Three times a day (TID) | ORAL | 3 refills | Status: AC | PRN

## 2022-05-23 MED ORDER — METOCLOPRAMIDE HCL 10 MG PO TABS: 10.0000 mg | ORAL_TABLET | Freq: Three times a day (TID) | ORAL | 2 refills | Status: AC | PRN

## 2022-05-23 MED ORDER — HYDROCHLOROTHIAZIDE 12.5 MG PO CAPS: 12.5000 mg | ORAL_CAPSULE | Freq: Every day | ORAL | 3 refills | Status: DC

## 2022-05-23 NOTE — Progress Notes (Signed)
BP (!) 140/70   Pulse (!) 55   Temp 98 F (36.7 C)   Ht 5' 4"  (1.626 m)   Wt 199 lb (90.3 kg)   SpO2 93%   BMI 34.16 kg/m    Subjective:   Patient ID: Michael Carrillo, male    DOB: 1960/10/22, 61 y.o.   MRN: [id number]  HPI: Michael Carrillo is a 61 y.o. male presenting on 05/23/2022 for Medical Management of Chronic Issues (CPE)   HPI Physical exam Patient denies any chest pain, shortness of breath, headaches or vision issues, abdominal complaints, diarrhea, nausea, vomiting, or joint issues.   Hypertension Patient is currently on hydrochlorothiazide, and their blood pressure today is 140/70. Patient denies any lightheadedness or dizziness. Patient denies headaches, blurred vision, chest pains, shortness of breath, or weakness. Denies any side effects from medication and is content with current medication.   Obesity Patient's hypertension are more complicated by the patient's obesity.  Discussed weight loss and lifestyle modification and exercise with the patient.   Relevant past medical, surgical, family and social history reviewed and updated as indicated. Interim medical history since our last visit reviewed. Allergies and medications reviewed and updated.  Review of Systems  Constitutional:  Negative for chills and fever.  Eyes:  Negative for visual disturbance.  Respiratory:  Negative for shortness of breath and wheezing.   Cardiovascular:  Negative for chest pain and leg swelling.  Musculoskeletal:  Negative for back pain and gait problem.  Skin:  Negative for rash.  Neurological:  Negative for dizziness, weakness and numbness.  All other systems reviewed and are negative.   Per HPI unless specifically indicated above   Allergies as of 05/23/2022   No Known Allergies      Medication List        Accurate as of May 23, 2022 10:53 AM. If you have any questions, ask your nurse or doctor.          aspirin EC 81 MG tablet Take 81 mg by mouth daily.    chlorproMAZINE 25 MG tablet Commonly known as: THORAZINE Take 1 tablet (25 mg total) by mouth 3 (three) times daily.   cyclobenzaprine 10 MG tablet Commonly known as: FLEXERIL Take 1 tablet (10 mg total) by mouth 3 (three) times daily as needed for muscle spasms.   etodolac 400 MG tablet Commonly known as: LODINE   fluticasone 50 MCG/ACT nasal spray Commonly known as: FLONASE USE 1 SPRAY(S) IN EACH NOSTRIL TWICE DAILY AS NEEDED FOR ALLERGIES   hydrochlorothiazide 12.5 MG capsule Commonly known as: Microzide Take 1 capsule (12.5 mg total) by mouth daily.   methylPREDNISolone 4 MG Tbpk tablet Commonly known as: MEDROL DOSEPAK Use as directed.   metoCLOPramide 10 MG tablet Commonly known as: Reglan Take 1 tablet (10 mg total) by mouth every 8 (eight) hours as needed for nausea.   ondansetron 4 MG tablet Commonly known as: ZOFRAN Take 1 tablet (4 mg total) by mouth every 8 (eight) hours as needed for nausea or vomiting.   sildenafil 20 MG tablet Commonly known as: REVATIO Take 1-4 tablets by mouth as needed What changed: See the new instructions. Changed by: Fransisca Kaufmann Alyxis Grippi, MD         Objective:   BP (!) 140/70   Pulse (!) 55   Temp 98 F (36.7 C)   Ht 5' 4"  (1.626 m)   Wt 199 lb (90.3 kg)   SpO2 93%   BMI 34.16 kg/m   Wt Readings  from Last 3 Encounters:  05/23/22 199 lb (90.3 kg)  07/31/21 192 lb 9.6 oz (87.4 kg)  05/01/21 193 lb 9.6 oz (87.8 kg)    Physical Exam Vitals and nursing note reviewed.  Constitutional:      General: He is not in acute distress.    Appearance: He is well-developed. He is not diaphoretic.  Eyes:     General: No scleral icterus.    Conjunctiva/sclera: Conjunctivae normal.  Neck:     Thyroid: No thyromegaly.  Cardiovascular:     Rate and Rhythm: Normal rate and regular rhythm.     Heart sounds: Normal heart sounds. No murmur heard. Pulmonary:     Effort: Pulmonary effort is normal. No respiratory distress.     Breath  sounds: Normal breath sounds. No wheezing.  Musculoskeletal:        General: Normal range of motion.     Cervical back: Neck supple.  Lymphadenopathy:     Cervical: No cervical adenopathy.  Skin:    General: Skin is warm and dry.     Findings: No rash.  Neurological:     Mental Status: He is alert and oriented to person, place, and time.     Coordination: Coordination normal.  Psychiatric:        Behavior: Behavior normal.       Assessment & Plan:   Problem List Items Addressed This Visit       Cardiovascular and Mediastinum   HTN (hypertension)   Relevant Medications   hydrochlorothiazide (MICROZIDE) 12.5 MG capsule   sildenafil (REVATIO) 20 MG tablet   Other Relevant Orders   CBC with Differential/Platelet   CMP14+EGFR   Lipid panel     Other   Obesity   Other Visit Diagnoses     Physical exam    -  Primary   Relevant Orders   CBC with Differential/Platelet   CMP14+EGFR   Lipid panel   PSA, total and free   Nausea       Relevant Medications   metoCLOPramide (REGLAN) 10 MG tablet   ondansetron (ZOFRAN) 4 MG tablet   Erectile dysfunction, unspecified erectile dysfunction type       Relevant Medications   sildenafil (REVATIO) 20 MG tablet   Need for shingles vaccine       Relevant Orders   Zoster Recombinant (Shingrix )   Prostate cancer screening       Relevant Orders   PSA, total and free     Patient gets intermittent nausea and just wants refills of medication for that.  He seems to be doing well otherwise.  Denies any major health issues. Follow up plan: Return in about 1 year (around 05/24/2023), or if symptoms worsen or fail to improve, for Physical exam and hypertension.  Counseling provided for all of the vaccine components Orders Placed This Encounter  Procedures   Zoster Recombinant (Shingrix )   CBC with Differential/Platelet   CMP14+EGFR   Lipid panel   PSA, total and free    Caryl Pina, MD [facility] 05/23/2022, 10:53 AM

## 2022-05-24 LAB — CMP14+EGFR
ALT: 29 IU/L (ref 0–44)
AST: 27 IU/L (ref 0–40)
Albumin/Globulin Ratio: 1.7 (ref 1.2–2.2)
Albumin: 4 g/dL (ref 3.9–4.9)
Alkaline Phosphatase: 56 IU/L (ref 44–121)
BUN/Creatinine Ratio: 12 (ref 10–24)
BUN: 11 mg/dL (ref 8–27)
Bilirubin Total: 0.3 mg/dL (ref 0.0–1.2)
CO2: 24 mmol/L (ref 20–29)
Calcium: 8.9 mg/dL (ref 8.6–10.2)
Chloride: 105 mmol/L (ref 96–106)
Creatinine, Ser: 0.92 mg/dL (ref 0.76–1.27)
Globulin, Total: 2.3 g/dL (ref 1.5–4.5)
Glucose: 95 mg/dL (ref 70–99)
Potassium: 4 mmol/L (ref 3.5–5.2)
Sodium: 142 mmol/L (ref 134–144)
Total Protein: 6.3 g/dL (ref 6.0–8.5)
eGFR: 94 mL/min/{1.73_m2} (ref >59)

## 2022-05-24 LAB — CBC WITH DIFFERENTIAL/PLATELET
Basophils Absolute: 0 10*3/uL (ref 0.0–0.2)
Basos: 1 %
EOS (ABSOLUTE): 0.2 10*3/uL (ref 0.0–0.4)
Eos: 4 %
Hematocrit: 42.9 % (ref 37.5–51.0)
Hemoglobin: 14.7 g/dL (ref 13.0–17.7)
Immature Grans (Abs): 0 10*3/uL (ref 0.0–0.1)
Immature Granulocytes: 0 %
Lymphocytes Absolute: 1.4 10*3/uL (ref 0.7–3.1)
Lymphs: 35 %
MCH: 31.4 pg (ref 26.6–33.0)
MCHC: 34.3 g/dL (ref 31.5–35.7)
MCV: 92 fL (ref 79–97)
Monocytes Absolute: 0.4 10*3/uL (ref 0.1–0.9)
Monocytes: 11 %
Neutrophils Absolute: 2 10*3/uL (ref 1.4–7.0)
Neutrophils: 49 %
Platelets: 344 10*3/uL (ref 150–450)
RBC: 4.68 x10E6/uL (ref 4.14–5.80)
RDW: 12.5 % (ref 11.6–15.4)
WBC: 4.1 10*3/uL (ref 3.4–10.8)

## 2022-05-24 LAB — LIPID PANEL
Chol/HDL Ratio: 2 ratio (ref 0.0–5.0)
Cholesterol, Total: 171 mg/dL (ref 100–199)
HDL: 84 mg/dL (ref >39)
LDL Chol Calc (NIH): 77 mg/dL (ref 0–99)
Triglycerides: 47 mg/dL (ref 0–149)
VLDL Cholesterol Cal: 10 mg/dL (ref 5–40)

## 2022-05-24 LAB — PSA, TOTAL AND FREE
PSA, Free Pct: 20 %
PSA, Free: 0.32 ng/mL
Prostate Specific Ag, Serum: 1.6 ng/mL (ref 0.0–4.0)

## 2022-07-05 DIAGNOSIS — Z23 Encounter for immunization: Secondary | ICD-10-CM | POA: Diagnosis not present

## 2023-01-16 ENCOUNTER — Ambulatory Visit (INDEPENDENT_AMBULATORY_CARE_PROVIDER_SITE_OTHER): Payer: BC Managed Care – PPO

## 2023-01-16 ENCOUNTER — Ambulatory Visit (INDEPENDENT_AMBULATORY_CARE_PROVIDER_SITE_OTHER): Payer: BC Managed Care – PPO | Admitting: Family Medicine

## 2023-01-16 ENCOUNTER — Encounter: Payer: Self-pay | Admitting: Family Medicine

## 2023-01-16 VITALS — BP 129/70 | HR 67 | Ht 64.0 in | Wt 210.0 lb

## 2023-01-16 DIAGNOSIS — M79672 Pain in left foot: Secondary | ICD-10-CM

## 2023-01-16 DIAGNOSIS — M19071 Primary osteoarthritis, right ankle and foot: Secondary | ICD-10-CM | POA: Diagnosis not present

## 2023-01-16 DIAGNOSIS — M2011 Hallux valgus (acquired), right foot: Secondary | ICD-10-CM | POA: Diagnosis not present

## 2023-01-16 DIAGNOSIS — M7732 Calcaneal spur, left foot: Secondary | ICD-10-CM | POA: Diagnosis not present

## 2023-01-16 DIAGNOSIS — M79671 Pain in right foot: Secondary | ICD-10-CM

## 2023-01-16 MED ORDER — PREDNISONE 20 MG PO TABS
ORAL_TABLET | ORAL | 0 refills | Status: DC
Start: 2023-01-16 — End: 2023-05-05

## 2023-01-16 NOTE — Progress Notes (Signed)
BP 129/70   Pulse 67   Ht  (1.626 m)   Wt 210 lb (95.3 kg)   SpO2 97%   BMI 36.05 kg/m    Subjective:   Patient ID: Michael Carrillo, male    DOB: [birth year], 62 y.o.   MRN: [id number]  HPI: [name] is a 62 y.o. male presenting on 01/16/2023 for Foot Pain (Bilateral. Started last week. Not sure what is causing.)   HPI Bilateral foot pain Patient has bilateral foot pain just on the heels of both of his feet that come up over the past few weeks.  He says he is try to change shoes recently and is still not helping and it hurts to stand.  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  He denies any redness or warmth or fevers or chills.  He has never had this like this before.  He does have an appointment with Dr. Ulice Brilliant next month but that was the soonest they could see him.  And hurts to stand on them and hurts with some movements.  It is worse in the morning but also worse throughout the day after he has been on it for a while.  Relevant past medical, surgical, family and social history reviewed and updated as indicated. Interim medical history since our last visit reviewed. Allergies and medications reviewed and updated.  Review of Systems  Constitutional:  Negative for chills and fever.  Respiratory:  Negative for shortness of breath and wheezing.   Cardiovascular:  Negative for chest pain and leg swelling.  Musculoskeletal:  Positive for arthralgias and gait problem. Negative for back pain and myalgias.  Skin:  Negative for rash.  All other systems reviewed and are negative.   Per HPI unless specifically indicated above   Allergies as of 01/16/2023   No Known Allergies      Medication List        Accurate as of January 16, 2023  4:24 PM. If you have any questions, ask your nurse or doctor.          STOP taking these medications    methylPREDNISolone 4 MG Tbpk tablet Commonly known as: MEDROL DOSEPAK Stopped by: Elige Radon Joncarlos Atkison, MD        TAKE these medications    aspirin EC 81 MG tablet Take 81 mg by mouth daily.   chlorproMAZINE 25 MG tablet Commonly known as: THORAZINE Take 1 tablet (25 mg total) by mouth 3 (three) times daily.   cyclobenzaprine 10 MG tablet Commonly known as: FLEXERIL Take 1 tablet (10 mg total) by mouth 3 (three) times daily as needed for muscle spasms.   etodolac 400 MG tablet Commonly known as: LODINE   fluticasone 50 MCG/ACT nasal spray Commonly known as: FLONASE USE 1 SPRAY(S) IN EACH NOSTRIL TWICE DAILY AS NEEDED FOR ALLERGIES   hydrochlorothiazide 12.5 MG capsule Commonly known as: Microzide Take 1 capsule (12.5 mg total) by mouth daily.   metoCLOPramide 10 MG tablet Commonly known as: Reglan Take 1 tablet (10 mg total) by mouth every 8 (eight) hours as needed for nausea.   ondansetron 4 MG tablet Commonly known as: ZOFRAN Take 1 tablet (4 mg total) by mouth every 8 (eight) hours as needed for nausea or vomiting.   predniSONE 20 MG tablet Commonly known as: DELTASONE Take 3 tabs daily for 1 week, then 2 tabs daily for week 2, then 1 tab daily for week 3. Started by: Nils Pyle, MD  sildenafil 20 MG tablet Commonly known as: REVATIO Take 1-4 tablets by mouth as needed         Objective:   BP 129/70   Pulse 67   Ht  (1.626 m)   Wt 210 lb (95.3 kg)   SpO2 97%   BMI 36.05 kg/m   Wt Readings from Last 3 Encounters:  01/16/23 210 lb (95.3 kg)  05/23/22 199 lb (90.3 kg)  07/31/21 192 lb 9.6 oz (87.4 kg)    Physical Exam Vitals and nursing note reviewed.  Constitutional:      Appearance: Normal appearance.  Musculoskeletal:       Feet:  Feet:     Right foot:     Skin integrity: Skin integrity normal.     Left foot:     Skin integrity: Skin integrity normal.  Neurological:     Mental Status: He is alert.    Bilateral foot x-ray: No acute bony abnormalities noted, await final read from radiologist.    Assessment & Plan:   Problem List  Items Addressed This Visit   None Visit Diagnoses     Pain in both feet    -  Primary   Relevant Medications   predniSONE (DELTASONE) 20 MG tablet   Other Relevant Orders   DG Foot Complete Left   DG Foot Complete Right     Will do course of prednisone and recommended icing with a water bottle and rolling and if not improved to come back and we can discuss injections.  Follow up plan: Return if symptoms worsen or fail to improve.  Counseling provided for all of the vaccine components Orders Placed This Encounter  Procedures   DG Foot Complete Left   DG Foot Complete Right    Arville Care, MD [facility] 01/16/2023, 4:24 PM

## 2023-01-22 ENCOUNTER — Telehealth: Payer: Self-pay | Admitting: Family Medicine

## 2023-01-22 NOTE — Telephone Encounter (Signed)
  Incoming Patient Call  01/22/2023  What symptoms do you have? Pain on back of foot  How long have you been sick? Since 01/16/23  Have you been seen for this problem? Yes 01/16/23, medication that was prescribed was for plantar fascitis and it is not working, he was told that he has a bone spur. He can barely walk he is in bad pain please advise  If your provider decides to give you a prescription, which pharmacy would you like for it to be sent to? [facility]   Patient informed that this information will be sent to the clinical staff for review and that they should receive a follow up call.

## 2023-01-22 NOTE — Telephone Encounter (Signed)
The neck step would be for him to either come in here with [place] or to the foot doctor to get an injection.  If he would like to do that then please schedule him an appointment

## 2023-01-23 NOTE — Telephone Encounter (Signed)
Pt informed of Dr. Darrol Poke recommendations. Pt states that he cannot wait until 5/13 to see Dr. Ulice Brilliant. He will keep the appt but would like to discuss pain and and different medication to help. Current med is not helping at all.  Offered an appt in office on 5/6 but pt cannot come in the mornings. Virtual visit scheduled for 5/3. Pts main concern is the pain and changing the medication to help.

## 2023-01-24 ENCOUNTER — Telehealth: Payer: BC Managed Care – PPO

## 2023-02-04 DIAGNOSIS — M79672 Pain in left foot: Secondary | ICD-10-CM | POA: Diagnosis not present

## 2023-02-04 DIAGNOSIS — M79671 Pain in right foot: Secondary | ICD-10-CM | POA: Diagnosis not present

## 2023-02-04 DIAGNOSIS — M722 Plantar fascial fibromatosis: Secondary | ICD-10-CM | POA: Diagnosis not present

## 2023-02-27 DIAGNOSIS — M722 Plantar fascial fibromatosis: Secondary | ICD-10-CM | POA: Diagnosis not present

## 2023-02-27 DIAGNOSIS — M79671 Pain in right foot: Secondary | ICD-10-CM | POA: Diagnosis not present

## 2023-04-04 ENCOUNTER — Encounter: Payer: Self-pay | Admitting: *Deleted

## 2023-05-05 ENCOUNTER — Encounter: Payer: Self-pay | Admitting: Family Medicine

## 2023-05-05 ENCOUNTER — Ambulatory Visit: Payer: BC Managed Care – PPO | Admitting: Family Medicine

## 2023-05-05 VITALS — BP 136/66 | HR 72 | Temp 98.6°F | Ht 64.0 in | Wt 207.4 lb

## 2023-05-05 DIAGNOSIS — S91102A Unspecified open wound of left great toe without damage to nail, initial encounter: Secondary | ICD-10-CM

## 2023-05-05 DIAGNOSIS — W540XXA Bitten by dog, initial encounter: Secondary | ICD-10-CM | POA: Diagnosis not present

## 2023-05-05 MED ORDER — AMOXICILLIN-POT CLAVULANATE 875-125 MG PO TABS
1.0000 | ORAL_TABLET | Freq: Two times a day (BID) | ORAL | 0 refills | Status: AC
Start: 2023-05-05 — End: 2023-05-10

## 2023-05-05 NOTE — Progress Notes (Signed)
Acute Office Visit  Subjective:     Patient ID: Michael Carrillo, male    DOB: Jun 24, 1961, 62 y.o.   MRN: [id number]  Chief Complaint  Patient presents with   Animal Bite    Animal Bite  The incident occurred yesterday (6:30 pm). The incident occurred at another residence (customer's house). There is an injury to the Left great toe. The pain is moderate. Associated symptoms include pain when bearing weight. Pertinent negatives include no chest pain, no fussiness, no numbness, no visual disturbance, no abdominal pain, no bowel incontinence, no nausea, no vomiting, no headaches, no hearing loss, no inability to bear weight, no neck pain, no focal weakness, no decreased responsiveness, no light-headedness, no loss of consciousness, no seizures, no tingling, no weakness, no cough, no difficulty breathing and no memory loss. There have been no prior injuries to these areas. His tetanus status is UTD. He has received no recent medical care.   Michael Carrillo was working on the [employer] car. When he was returning the owner's car, he opened the car door to get out and extended his left foot. This is when he was bite in the foot once by the dog, a german shepard who is known to be aggressive. He did have a shoes on. The bit did break the skin. Minimal bleeding. Dog was not acting abnormally. Customer reports dog is UTD on rabies vaccines. Patient is UTD on tetanus vaccine with this completed in the last 5 years.   [place] Research scientist (life [employer]) was notify and provided with both the patient and the owner's contact information. They reported that they would contact the patient and owner.   Review of Systems  Constitutional:  Negative for decreased responsiveness.  HENT:  Negative for hearing loss.   Eyes:  Negative for visual disturbance.  Respiratory:  Negative for cough.   Cardiovascular:  Negative for chest pain.  Gastrointestinal:  Negative for abdominal pain, bowel incontinence, nausea and vomiting.   Musculoskeletal:  Negative for neck pain.  Neurological:  Negative for tingling, focal weakness, seizures, loss of consciousness, weakness, light-headedness, numbness and headaches.  Psychiatric/Behavioral:  Negative for memory loss.         Objective:    BP 136/66   Pulse 72   Temp 98.6 F (37 C) (Temporal)   Ht 5\' 4"  (1.626 m)   Wt 207 lb 6 oz (94.1 kg)   SpO2 95%   BMI 35.60 kg/m    Physical Exam Vitals and nursing note reviewed.  Constitutional:      General: He is not in acute distress.    Appearance: Normal appearance. He is not ill-appearing, toxic-appearing or diaphoretic.  Cardiovascular:     Rate and Rhythm: Normal rate and regular rhythm.     Heart sounds: Normal heart sounds. No murmur heard. Musculoskeletal:     Right lower leg: No edema.     Left lower leg: No edema.     Left foot: Normal range of motion and normal capillary refill. Laceration (shallow laceration to left great toe on medial aspect, near nail bed. No injury to nail bed. Mild tenderness. No erythema, exudate, warmth, active bleeding. No foreign bodies.) present. No swelling or bony tenderness. Normal pulse.  Skin:    General: Skin is warm and dry.  Neurological:     General: No focal deficit present.     Mental Status: He is alert and oriented to person, place, and time.  Psychiatric:        Mood and  Affect: Mood normal.        Behavior: Behavior normal.     No results found for any visits on 05/05/23.      Assessment & Plan:   Michael "Tim" was seen today for animal bite.  Diagnoses and all orders for this visit:  Dog bite, initial encounter Shallow laceration. No current signs of infection. Wound cleansed today. No foreign bodies appreciated. Neurovascular and ROM intact. Augmentin ordered for prophylaxis as below. Animal control contacted and provided with contact information for patient and dog owner. They will contact both. He is UTD on tetanus. Repots dog was UTD on rabies  vaccine. Strict return precautions given.  -     amoxicillin-clavulanate (AUGMENTIN) 875-125 MG tablet; Take 1 tablet by mouth 2 (two) times daily for 5 days.   The patient indicates understanding of these issues and agrees with the plan.  Gabriel Earing, FNP

## 2023-05-05 NOTE — Patient Instructions (Signed)
Animal Bite, Adult Animal bites range from mild to serious. An animal bite can result in any of these injuries: A scratch. A deep, open cut. Broken (punctured) or torn skin. A crush injury. A bone injury. A small bite from a house pet is usually less serious than a bite from a stray or wild animal. Cat bites can be more serious because their long, thin teeth can cause deep puncture wounds that close fast, trapping bacteria inside. Stray or wild animals, such as a raccoon, fox, skunk, or bat, are at higher risk of carrying a serious infection called rabies, which they can pass to a human through a bite. A bite from one of these animals needs medical care right away and, sometimes, rabies vaccination. What increases the risk? You are more likely to be bitten by an animal if: You are around unfamiliar pets. You disturb an animal when it is eating, sleeping, or caring for its babies. You are outdoors in a place where small, wild animals roam freely. What are the signs or symptoms? Common symptoms of an animal bite include: Pain. Bleeding. Swelling. Bruising. How is this diagnosed? This condition may be diagnosed based on a physical exam and medical history. Your health care provider will examine your wound and ask for details about the animal and how the bite happened. You may also have tests, such as: Blood tests to check for infection. X-rays to check for damage to bones or joints. Taking a fluid sample from your wound and checking it for infection (culture test). How is this treated? Treatment depends on the type of animal, where the bite is on your body, and your medical history. Treatment may include: Wound care. This often includes cleaning the wound and rinsing it out (flushing it) with saline solution, which is made of salt and water. A bandage (dressing) is also often applied. In rare cases, the wound may be closed with stitches (sutures), staples, skin glue, or adhesive  strips. Antibiotic medicine to prevent or treat infection. This medicine may be prescribed in pill or ointment form. If the bite area gets infected, the medicine may be given through an IV. A tetanus shot to prevent tetanus infection. Rabies treatment to prevent rabies infection, if the animal could have rabies. Surgery. This may be done if a bite gets infected or causes damage that needs to be repaired. Follow these instructions at home: Medicines Take or apply over-the-counter and prescription medicines only as told by your health care provider. If you were prescribed an antibiotic medicine, take or apply it as told by your health care provider. Do not stop using the antibiotic even if you start to feel better. Wound care  Follow instructions from your health care provider about how to take care of your wound. Make sure you: Wash your hands with soap and water for at least 20 seconds before and after you change your dressing. If soap and water are not available, use hand sanitizer. Change your dressing as told by your health care provider. Leave sutures, skin glue, or adhesive strips in place. These skin closures may need to stay in place for 2 weeks or longer. If adhesive strip edges start to loosen and curl up, you may trim the loose edges. Do not remove adhesive strips completely unless your health care provider tells you to do that. Check your wound every day for signs of infection. Check for: More redness, swelling, or pain. More fluid or blood. Warmth. Pus or a bad smell. General  instructions  Raise (elevate) the injured area above the level of your heart while you are sitting or lying down, if this is possible. If directed, put ice on the injured area. To do this: Put ice in a plastic bag. Place a towel between your skin and the bag. Leave the ice on for 20 minutes, 2-3 times per day. Remove the ice if your skin turns bright red. This is very important. If you cannot feel pain,  heat, or cold, you have a greater risk of damage to the area. Keep all follow-up visits. This is important. Contact a health care provider if: You have more redness, swelling, or pain around your wound. Your wound feels warm to the touch. You have a fever or chills. You have a general feeling of sickness (malaise). You feel nauseous or you vomit. You have pain that does not get better. Get help right away if: You have a red streak that leads away from your wound. You have non-clear fluid or more blood coming from your wound. There is pus or a bad smell coming from your wound. You have trouble moving your injured area. You have numbness or tingling that spreads beyond your wound. Summary Animal bites can range from mild to serious. An animal bite can cause a scratch on the skin, a deep and open cut, torn or punctured skin, a crush injury, or a bone injury. A bite from a stray or wild animal needs medical care right away and, sometimes, rabies vaccination. Your health care provider will examine your wound and ask for details about the animal and how the bite happened. Treatment may include wound care, antibiotic medicine, a tetanus shot, and rabies treatment if the animal could have rabies. This information is not intended to replace advice given to you by your health care provider. Make sure you discuss any questions you have with your health care provider. Document Revised: 09/14/2021 Document Reviewed: 09/14/2021 Elsevier Patient Education  2024 [employer].

## 2023-05-28 ENCOUNTER — Encounter: Payer: BC Managed Care – PPO | Admitting: Family Medicine

## 2023-07-22 ENCOUNTER — Encounter: Payer: Self-pay | Admitting: Family Medicine

## 2023-07-22 ENCOUNTER — Ambulatory Visit (INDEPENDENT_AMBULATORY_CARE_PROVIDER_SITE_OTHER): Payer: BC Managed Care – PPO | Admitting: Family Medicine

## 2023-07-22 VITALS — BP 152/69 | HR 58 | Ht 64.0 in | Wt 213.0 lb

## 2023-07-22 DIAGNOSIS — Z0000 Encounter for general adult medical examination without abnormal findings: Secondary | ICD-10-CM | POA: Diagnosis not present

## 2023-07-22 DIAGNOSIS — I10 Essential (primary) hypertension: Secondary | ICD-10-CM | POA: Diagnosis not present

## 2023-07-22 DIAGNOSIS — Z125 Encounter for screening for malignant neoplasm of prostate: Secondary | ICD-10-CM | POA: Diagnosis not present

## 2023-07-22 DIAGNOSIS — Z0001 Encounter for general adult medical examination with abnormal findings: Secondary | ICD-10-CM | POA: Diagnosis not present

## 2023-07-22 DIAGNOSIS — E66812 Obesity, class 2: Secondary | ICD-10-CM

## 2023-07-22 DIAGNOSIS — Z6835 Body mass index (BMI) 35.0-35.9, adult: Secondary | ICD-10-CM

## 2023-07-22 MED ORDER — HYDROCHLOROTHIAZIDE 25 MG PO TABS
25.0000 mg | ORAL_TABLET | Freq: Every day | ORAL | 3 refills | Status: DC
Start: 2023-07-22 — End: 2024-02-23

## 2023-07-22 MED ORDER — CONTRAVE 8-90 MG PO TB12
ORAL_TABLET | ORAL | 0 refills | Status: AC
Start: 2023-07-22 — End: ?

## 2023-07-22 MED ORDER — CONTRAVE 8-90 MG PO TB12
2.0000 | ORAL_TABLET | Freq: Two times a day (BID) | ORAL | 2 refills | Status: AC
Start: 2023-07-22 — End: ?

## 2023-07-22 NOTE — Progress Notes (Signed)
BP (!) 152/69   Pulse (!) 58   Ht 5\' 4"  (1.626 m)   Wt 213 lb (96.6 kg)   SpO2 96%   BMI 36.56 kg/m    Subjective:   Patient ID: Michael Carrillo, male    DOB: [birth year], 62 y.o.   MRN: [id number]  HPI: Michael Carrillo is a 62 y.o. male presenting on 07/22/2023 for Medical Management of Chronic Issues (CPE)   HPI Physical exam Patient denies any chest pain, shortness of breath, headaches or vision issues, abdominal complaints, diarrhea, nausea, vomiting, or joint issues.   Hypertension Patient is currently on hydrochlorothiazide, and their blood pressure today is 152/69. Patient denies any lightheadedness or dizziness. Patient denies headaches, blurred vision, chest pains, shortness of breath, or weakness. Denies any side effects from medication and is content with current medication.   Obesity Patient is coming in today for obesity check.  He did try Saxenda for a little while in 2022 and it did help him lose weight but then he started getting a lot of bruising from the shot.  He would like to try something for weight loss but he does not want to try an injectable because of those issues.  Relevant past medical, surgical, family and social history reviewed and updated as indicated. Interim medical history since our last visit reviewed. Allergies and medications reviewed and updated.  Review of Systems  Constitutional:  Negative for chills and fever.  HENT:  Negative for ear pain and tinnitus.   Eyes:  Negative for pain and visual disturbance.  Respiratory:  Negative for cough, shortness of breath and wheezing.   Cardiovascular:  Negative for chest pain, palpitations and leg swelling.  Gastrointestinal:  Negative for abdominal pain, blood in stool, constipation and diarrhea.  Genitourinary:  Negative for dysuria and hematuria.  Musculoskeletal:  Negative for back pain, gait problem and myalgias.  Skin:  Negative for rash.  Neurological:  Negative for dizziness, weakness,  light-headedness and headaches.  Psychiatric/Behavioral:  Negative for suicidal ideas.   All other systems reviewed and are negative.   Per HPI unless specifically indicated above   Allergies as of 07/22/2023   No Known Allergies      Medication List        Accurate as of July 22, 2023  2:54 PM. If you have any questions, ask your nurse or doctor.          STOP taking these medications    hydrochlorothiazide 12.5 MG capsule Commonly known as: Microzide Replaced by: hydrochlorothiazide 25 MG tablet Stopped by: Elige Radon Dayle Sherpa       TAKE these medications    aspirin EC 81 MG tablet Take 81 mg by mouth daily.   chlorproMAZINE 25 MG tablet Commonly known as: THORAZINE Take 1 tablet (25 mg total) by mouth 3 (three) times daily.   Contrave 8-90 MG Tb12 Generic drug: Naltrexone-buPROPion HCl ER 1 tablet Po Qam x7d ; then 1 tab po BID x 7 d; then 2 tab in AM and 1 in PM X7d; Then 2 po BID from then on Started by: Ivin Booty A Herbie Lehrmann   Contrave 8-90 MG Tb12 Generic drug: Naltrexone-buPROPion HCl ER Take 2 tablets by mouth 2 (two) times daily. Started by: Elige Radon Rukiya Hodgkins   cyclobenzaprine 10 MG tablet Commonly known as: FLEXERIL Take 1 tablet (10 mg total) by mouth 3 (three) times daily as needed for muscle spasms.   etodolac 400 MG tablet Commonly known as: LODINE   fluticasone 50 MCG/ACT  nasal spray Commonly known as: FLONASE USE 1 SPRAY(S) IN EACH NOSTRIL TWICE DAILY AS NEEDED FOR ALLERGIES   hydrochlorothiazide 25 MG tablet Commonly known as: HYDRODIURIL Take 1 tablet (25 mg total) by mouth daily. Replaces: hydrochlorothiazide 12.5 MG capsule Started by: Elige Radon Laquita Harlan   metoCLOPramide 10 MG tablet Commonly known as: Reglan Take 1 tablet (10 mg total) by mouth every 8 (eight) hours as needed for nausea.   ondansetron 4 MG tablet Commonly known as: ZOFRAN Take 1 tablet (4 mg total) by mouth every 8 (eight) hours as needed for nausea or  vomiting.   sildenafil 20 MG tablet Commonly known as: REVATIO Take 1-4 tablets by mouth as needed         Objective:   BP (!) 152/69   Pulse (!) 58   Ht 5\' 4"  (1.626 m)   Wt 213 lb (96.6 kg)   SpO2 96%   BMI 36.56 kg/m   Wt Readings from Last 3 Encounters:  07/22/23 213 lb (96.6 kg)  05/05/23 207 lb 6 oz (94.1 kg)  01/16/23 210 lb (95.3 kg)    Physical Exam Vitals reviewed.  Constitutional:      General: He is not in acute distress.    Appearance: He is well-developed. He is not diaphoretic.  HENT:     Right Ear: External ear normal.     Left Ear: External ear normal.     Nose: Nose normal.     Mouth/Throat:     Pharynx: No oropharyngeal exudate.  Eyes:     General: No scleral icterus.       Right eye: No discharge.     Conjunctiva/sclera: Conjunctivae normal.     Pupils: Pupils are equal, round, and reactive to light.  Neck:     Thyroid: No thyromegaly.  Cardiovascular:     Rate and Rhythm: Normal rate and regular rhythm.     Heart sounds: Normal heart sounds. No murmur heard. Pulmonary:     Effort: Pulmonary effort is normal. No respiratory distress.     Breath sounds: Normal breath sounds. No wheezing.  Abdominal:     General: Bowel sounds are normal. There is no distension.     Palpations: Abdomen is soft.     Tenderness: There is no abdominal tenderness. There is no guarding or rebound.  Musculoskeletal:        General: No swelling. Normal range of motion.     Cervical back: Neck supple.  Lymphadenopathy:     Cervical: No cervical adenopathy.  Skin:    General: Skin is warm and dry.     Findings: No rash.  Neurological:     Mental Status: He is alert and oriented to person, place, and time.     Coordination: Coordination normal.  Psychiatric:        Behavior: Behavior normal.       Assessment & Plan:   Problem List Items Addressed This Visit       Cardiovascular and Mediastinum   HTN (hypertension)   Relevant Medications    hydrochlorothiazide (HYDRODIURIL) 25 MG tablet   Other Relevant Orders   CMP14+EGFR   Lipid panel   PSA, total and free     Other   Obesity   Relevant Medications   Naltrexone-buPROPion HCl ER (CONTRAVE) 8-90 MG TB12   Naltrexone-buPROPion HCl ER (CONTRAVE) 8-90 MG TB12   Other Relevant Orders   PSA, total and free   Other Visit Diagnoses     Physical exam    -  Primary   Relevant Orders   CBC with Differential/Platelet   CMP14+EGFR   Lipid panel   PSA, total and free       Increase hydrochlorothiazide to 25 mg daily.  Will check blood work today.  Prescribed Contrave to help with weight loss, also gave number for the healthy weight and wellness clinic so he can contact them for weight loss and obesity management. Follow up plan: Return in about 1 year (around 07/21/2024), or if symptoms worsen or fail to improve, for Physical exam and hypertension.  Counseling provided for all of the vaccine components Orders Placed This Encounter  Procedures   CBC with Differential/Platelet   CMP14+EGFR   Lipid panel   PSA, total and free    Arville Care, MD [facility] 07/22/2023, 2:54 PM

## 2023-07-22 NOTE — Patient Instructions (Addendum)
[facility] at [facility] Address: [address] [place], [place], [place] [address] Hours:  Open ? Closes 5?PM Phone: [phone]   [facility] at [facility] Address: [address], [place], [place] [address] Hours: Open ? Closes 5?PM Phone: [phone]

## 2023-07-23 ENCOUNTER — Encounter: Payer: BC Managed Care – PPO | Admitting: Family Medicine

## 2023-07-23 LAB — LIPID PANEL
Chol/HDL Ratio: 3.1 ratio (ref 0.0–5.0)
Cholesterol, Total: 214 mg/dL — ABNORMAL HIGH (ref 100–199)
HDL: 70 mg/dL (ref >39)
LDL Chol Calc (NIH): 131 mg/dL — ABNORMAL HIGH (ref 0–99)
Triglycerides: 75 mg/dL (ref 0–149)
VLDL Cholesterol Cal: 13 mg/dL (ref 5–40)

## 2023-07-23 LAB — CBC WITH DIFFERENTIAL/PLATELET
Basophils Absolute: 0.1 10*3/uL (ref 0.0–0.2)
Basos: 1 %
EOS (ABSOLUTE): 0.3 10*3/uL (ref 0.0–0.4)
Eos: 6 %
Hematocrit: 44.8 % (ref 37.5–51.0)
Hemoglobin: 14.9 g/dL (ref 13.0–17.7)
Immature Grans (Abs): 0 10*3/uL (ref 0.0–0.1)
Immature Granulocytes: 0 %
Lymphocytes Absolute: 2.1 10*3/uL (ref 0.7–3.1)
Lymphs: 40 %
MCH: 30.7 pg (ref 26.6–33.0)
MCHC: 33.3 g/dL (ref 31.5–35.7)
MCV: 92 fL (ref 79–97)
Monocytes Absolute: 0.5 10*3/uL (ref 0.1–0.9)
Monocytes: 9 %
Neutrophils Absolute: 2.3 10*3/uL (ref 1.4–7.0)
Neutrophils: 44 %
Platelets: 309 10*3/uL (ref 150–450)
RBC: 4.85 x10E6/uL (ref 4.14–5.80)
RDW: 12.9 % (ref 11.6–15.4)
WBC: 5.2 10*3/uL (ref 3.4–10.8)

## 2023-07-23 LAB — PSA, TOTAL AND FREE
PSA, Free Pct: 23.8 %
PSA, Free: 0.38 ng/mL
Prostate Specific Ag, Serum: 1.6 ng/mL (ref 0.0–4.0)

## 2023-07-23 LAB — CMP14+EGFR
ALT: 28 [IU]/L (ref 0–44)
AST: 27 [IU]/L (ref 0–40)
Albumin: 4.4 g/dL (ref 3.9–4.9)
Alkaline Phosphatase: 64 [IU]/L (ref 44–121)
BUN/Creatinine Ratio: 15 (ref 10–24)
BUN: 16 mg/dL (ref 8–27)
Bilirubin Total: 0.3 mg/dL (ref 0.0–1.2)
CO2: 25 mmol/L (ref 20–29)
Calcium: 9.4 mg/dL (ref 8.6–10.2)
Chloride: 104 mmol/L (ref 96–106)
Creatinine, Ser: 1.07 mg/dL (ref 0.76–1.27)
Globulin, Total: 2.2 g/dL (ref 1.5–4.5)
Glucose: 89 mg/dL (ref 70–99)
Potassium: 3.9 mmol/L (ref 3.5–5.2)
Sodium: 141 mmol/L (ref 134–144)
Total Protein: 6.6 g/dL (ref 6.0–8.5)
eGFR: 77 mL/min/{1.73_m2} (ref >59)

## 2024-02-23 ENCOUNTER — Other Ambulatory Visit: Payer: Self-pay

## 2024-02-23 ENCOUNTER — Telehealth: Payer: Self-pay

## 2024-02-23 DIAGNOSIS — I10 Essential (primary) hypertension: Secondary | ICD-10-CM

## 2024-02-23 MED ORDER — HYDROCHLOROTHIAZIDE 25 MG PO TABS: 25.0000 mg | ORAL_TABLET | Freq: Every day | ORAL | 2 refills | Status: AC

## 2024-02-23 NOTE — Telephone Encounter (Signed)
 Refill sent to pharmacy, patient notified, verbalized understanding.

## 2024-02-23 NOTE — Telephone Encounter (Signed)
 Copied from CRM [id number]. Topic: Clinical - Medication Question >> Feb 23, 2024 12:35 PM Crispin Dolphin wrote: Reason for CRM: Patient wife called. Not listed on DPR. No info provided. Wanted to know if he needed to be seen in order to get a refill on medication. Would like someone to reach out to patient to let him know. Thank You

## 2024-02-23 NOTE — Telephone Encounter (Signed)
 Pt needs refill for hydrochlorothiazide . Pt last seen 06/2023 and was advised to return in 1 year. Informed pt I will send a message to dr Dettinger if it is okay to refill medication and will call back once he responds. Pt verbalized understanding.

## 2024-02-23 NOTE — Telephone Encounter (Signed)
 Yes last October I sent enough that he should have enough to get through to October but go ahead and send him enough to get through to October.

## 2024-04-22 DIAGNOSIS — M79674 Pain in right toe(s): Secondary | ICD-10-CM | POA: Diagnosis not present

## 2024-04-22 DIAGNOSIS — M79675 Pain in left toe(s): Secondary | ICD-10-CM | POA: Diagnosis not present

## 2024-04-22 DIAGNOSIS — L03031 Cellulitis of right toe: Secondary | ICD-10-CM | POA: Diagnosis not present

## 2024-07-12 ENCOUNTER — Encounter: Payer: Self-pay | Admitting: *Deleted

## 2024-07-22 ENCOUNTER — Encounter: Payer: BC Managed Care – PPO | Admitting: Family Medicine

## 2024-07-30 ENCOUNTER — Encounter: Payer: Self-pay | Admitting: Family Medicine

## 2024-07-30 ENCOUNTER — Ambulatory Visit: Payer: Self-pay | Admitting: Family Medicine

## 2024-07-30 VITALS — BP 160/80 | HR 71 | Ht 64.0 in | Wt 221.0 lb

## 2024-07-30 DIAGNOSIS — E66812 Obesity, class 2: Secondary | ICD-10-CM

## 2024-07-30 DIAGNOSIS — Z0001 Encounter for general adult medical examination with abnormal findings: Secondary | ICD-10-CM

## 2024-07-30 DIAGNOSIS — I10 Essential (primary) hypertension: Secondary | ICD-10-CM

## 2024-07-30 DIAGNOSIS — Z23 Encounter for immunization: Secondary | ICD-10-CM

## 2024-07-30 DIAGNOSIS — Z6835 Body mass index (BMI) 35.0-35.9, adult: Secondary | ICD-10-CM

## 2024-07-30 DIAGNOSIS — Z0000 Encounter for general adult medical examination without abnormal findings: Secondary | ICD-10-CM

## 2024-07-30 LAB — LIPID PANEL

## 2024-07-30 NOTE — Progress Notes (Signed)
 BP (!) 187/70   Pulse 71   Ht 5' 4 (1.626 m)   Wt 221 lb (100.2 kg)   SpO2 97%   BMI 37.93 kg/m    Subjective:   Patient ID: Michael Carrillo, male    DOB: [birth year], 63 y.o.   MRN: [id number]  HPI: Michael Carrillo is a 63 y.o. male presenting on 07/30/2024 for Medical Management of Chronic Issues (CPE) and Hypertension   Discussed the use of AI scribe software for clinical note transcription with the patient, who gave verbal consent to proceed.  History of Present Illness   Michael Carrillo is a 63 year old male who presents with elevated blood pressure.  Hypertension - Elevated blood pressure readings of 174/87 mmHg and 187 mmHg during today's visit - No prior history of such high blood pressure readings - No recent home blood pressure monitoring - Did not take blood pressure medication today - Currently taking hydrochlorothiazide   Medication use - Daily use of baby aspirin - Intermittent use of Flonase  as needed - History of using anti-inflammatory medication and muscle relaxers as needed - Previously used weight loss medication in injectable form, covered by insurance a couple of years ago - Did not receive pill form of weight loss medication last year due to insurance coverage issues  Weight management - Actively managing weight by avoiding fried foods and choosing baked foods - Mindful of portion sizes during meals - 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 Interim medical history since our last visit reviewed. Allergies and medications reviewed and updated.  Review of Systems  Constitutional:  Negative for chills and fever.  Eyes:  Negative for visual disturbance.  Respiratory:  Negative for shortness of breath and wheezing.   Cardiovascular:  Negative for chest pain and leg swelling.  Musculoskeletal:  Negative for back pain and  gait problem.  Skin:  Negative for rash.  Neurological:  Negative for dizziness and light-headedness.  All other systems reviewed and are negative.   Per HPI unless specifically indicated above   Allergies as of 07/30/2024   No Known Allergies      Medication List        Accurate as of July 30, 2024  9:50 AM. If you have any questions, ask your nurse or doctor.          aspirin EC 81 MG tablet Take 81 mg by mouth daily.   chlorproMAZINE  25 MG tablet Commonly known as: THORAZINE  Take 1 tablet (25 mg total) by mouth 3 (three) times daily.   Contrave  8-90 MG Tb12 Generic drug: Naltrexone-buPROPion HCl ER 1 tablet Po Qam x7d ; then 1 tab po BID x 7 d; then 2 tab in AM and 1 in PM X7d; Then 2 po BID from then on   Contrave  8-90 MG Tb12 Generic drug: Naltrexone-buPROPion HCl ER Take 2 tablets by mouth 2 (two) times daily.   cyclobenzaprine  10 MG tablet Commonly known as: FLEXERIL  Take 1 tablet (10 mg total) by mouth 3 (three) times daily as needed for muscle spasms.   etodolac 400 MG tablet Commonly known as: LODINE   fluticasone  50 MCG/ACT nasal spray Commonly known as: FLONASE  USE 1 SPRAY(S) IN EACH NOSTRIL TWICE DAILY AS NEEDED FOR ALLERGIES   hydrochlorothiazide  25 MG tablet Commonly known as: HYDRODIURIL  Take 1 tablet (25 mg total) by mouth daily.  metoCLOPramide  10 MG tablet Commonly known as: Reglan  Take 1 tablet (10 mg total) by mouth every 8 (eight) hours as needed for nausea.   ondansetron  4 MG tablet Commonly known as: ZOFRAN  Take 1 tablet (4 mg total) by mouth every 8 (eight) hours as needed for nausea or vomiting.   sildenafil  20 MG tablet Commonly known as: REVATIO  Take 1-4 tablets by mouth as needed         Objective:   BP (!) 187/70   Pulse 71   Ht 5' 4 (1.626 m)   Wt 221 lb (100.2 kg)   SpO2 97%   BMI 37.93 kg/m   Wt Readings from Last 3 Encounters:  07/30/24 221 lb (100.2 kg)  07/22/23 213 lb (96.6 kg)  05/05/23 207 lb  6 oz (94.1 kg)    Physical Exam Physical Exam   VITALS: BP- 174/ NECK: Thyroid  without nodules. CHEST: Lungs clear to auscultation bilaterally. CARDIOVASCULAR: Heart regular rate and rhythm, no murmurs. MUSCULOSKELETAL: Knee reflexes normal, no swelling.         Assessment & Plan:   Problem List Items Addressed This Visit       Cardiovascular and Mediastinum   HTN (hypertension)   Relevant Orders   CBC with Differential/Platelet   CMP14+EGFR   Lipid panel   TSH     Other   Obesity   Other Visit Diagnoses       Physical exam    -  Primary   Relevant Orders   CBC with Differential/Platelet   CMP14+EGFR   Lipid panel   TSH   PSA, total and free     Encounter for immunization       Relevant Orders   Pneumococcal conjugate vaccine 20-valent (Completed)          Adult Wellness Visit Routine wellness visit with focus on health maintenance. - Administered pneumonia vaccine. - Ordered blood work.  Essential hypertension Elevated blood pressure readings, possibly due to anxiety. Medication not taken today. - Monitor blood pressure at home and report readings.  Obesity, class 2 Class 2 obesity with previous unsuccessful medication attempts. Discussed Wegovy or Ozempic with potential insurance limitations. - Attempted to obtain insurance coverage for [facility] or Ozempic. - Encouraged continued lifestyle modifications including portion control and regular physical activity.  General Health Maintenance Discussed diet and exercise. Current medications include baby aspirin and Flonase . - Continue baby aspirin. - Prescribed Flonase  as needed. - Encouraged use of smaller plates to reduce portion sizes.      Blood pressure was better on recheck in the 160s systolic but I want him to recheck it at home for 2 weeks.  It may just be because he has not taken his medicine today and then he will let me know how it is running.    Follow up plan: Return in about 1 year  (around 07/30/2025), or if symptoms worsen or fail to improve, for Physical exam.  Counseling provided for all of the vaccine components Orders Placed This Encounter  Procedures   Pneumococcal conjugate vaccine 20-valent   CBC with Differential/Platelet   CMP14+EGFR   Lipid panel   TSH   PSA, total and free    Fonda Levins, MD [facility] 07/30/2024, 9:50 AM

## 2024-07-31 LAB — LIPID PANEL
Chol/HDL Ratio: 2.7 ratio (ref 0.0–5.0)
Cholesterol, Total: 198 mg/dL (ref 100–199)
HDL: 73 mg/dL (ref >39)
LDL Chol Calc (NIH): 107 mg/dL — ABNORMAL HIGH (ref 0–99)
Triglycerides: 103 mg/dL (ref 0–149)
VLDL Cholesterol Cal: 18 mg/dL (ref 5–40)

## 2024-07-31 LAB — CBC WITH DIFFERENTIAL/PLATELET
Basophils Absolute: 0.1 x10E3/uL (ref 0.0–0.2)
Basos: 1 %
EOS (ABSOLUTE): 0.3 x10E3/uL (ref 0.0–0.4)
Eos: 5 %
Hematocrit: 48.3 % (ref 37.5–51.0)
Hemoglobin: 15.9 g/dL (ref 13.0–17.7)
Immature Grans (Abs): 0 x10E3/uL (ref 0.0–0.1)
Immature Granulocytes: 0 %
Lymphocytes Absolute: 2 x10E3/uL (ref 0.7–3.1)
Lymphs: 34 %
MCH: 30.2 pg (ref 26.6–33.0)
MCHC: 32.9 g/dL (ref 31.5–35.7)
MCV: 92 fL (ref 79–97)
Monocytes Absolute: 0.5 x10E3/uL (ref 0.1–0.9)
Monocytes: 8 %
Neutrophils Absolute: 3 x10E3/uL (ref 1.4–7.0)
Neutrophils: 52 %
Platelets: 332 x10E3/uL (ref 150–450)
RBC: 5.26 x10E6/uL (ref 4.14–5.80)
RDW: 12.6 % (ref 11.6–15.4)
WBC: 5.8 x10E3/uL (ref 3.4–10.8)

## 2024-07-31 LAB — PSA, TOTAL AND FREE
PSA, Free Pct: 22.4 %
PSA, Free: 0.38 ng/mL
Prostate Specific Ag, Serum: 1.7 ng/mL (ref 0.0–4.0)

## 2024-07-31 LAB — CMP14+EGFR
ALT: 26 IU/L (ref 0–44)
AST: 26 IU/L (ref 0–40)
Albumin: 4.3 g/dL (ref 3.9–4.9)
Alkaline Phosphatase: 73 IU/L (ref 47–123)
BUN/Creatinine Ratio: 14 (ref 10–24)
BUN: 14 mg/dL (ref 8–27)
Bilirubin Total: 0.3 mg/dL (ref 0.0–1.2)
CO2: 24 mmol/L (ref 20–29)
Calcium: 9.7 mg/dL (ref 8.6–10.2)
Chloride: 103 mmol/L (ref 96–106)
Creatinine, Ser: 0.98 mg/dL (ref 0.76–1.27)
Globulin, Total: 2.5 g/dL (ref 1.5–4.5)
Glucose: 94 mg/dL (ref 70–99)
Potassium: 4.2 mmol/L (ref 3.5–5.2)
Sodium: 141 mmol/L (ref 134–144)
Total Protein: 6.8 g/dL (ref 6.0–8.5)
eGFR: 86 mL/min/1.73 (ref >59)

## 2024-07-31 LAB — TSH: TSH: 1.19 u[IU]/mL (ref 0.450–4.500)

## 2024-08-03 ENCOUNTER — Telehealth: Payer: Self-pay | Admitting: Family Medicine

## 2024-08-03 NOTE — Telephone Encounter (Signed)
 Waiting on PCP to review. Will call patient once PCP has had time to review.

## 2024-08-03 NOTE — Telephone Encounter (Unsigned)
 Copied from CRM [id number]. Topic: Clinical - Lab/Test Results >> Aug 03, 2024  4:16 PM Zebedee SAUNDERS wrote: Reason for CRM: Pt would like a call [phone] back regarding his lab results.

## 2024-08-04 ENCOUNTER — Ambulatory Visit: Payer: Self-pay | Admitting: Family Medicine

## 2024-08-04 MED ORDER — ROSUVASTATIN CALCIUM 5 MG PO TABS: 5.0000 mg | ORAL_TABLET | Freq: Every day | ORAL | 3 refills | Status: AC

## 2024-08-04 NOTE — Telephone Encounter (Signed)
 Patient aware and verbalizes understanding. Agree's to starting medication - medication sent.

## 2025-08-01 ENCOUNTER — Encounter: Admitting: Family Medicine
